# Patient Record
Sex: Female | Born: 1950 | Race: White | Hispanic: No | Marital: Married | Smoking: Never smoker
Health system: Southern US, Community
[De-identification: ages and names within clinical notes are randomized; demographics above are authoritative.]

## PROBLEM LIST (undated history)

## (undated) DIAGNOSIS — I10 Essential (primary) hypertension: Secondary | ICD-10-CM

## (undated) DIAGNOSIS — F419 Anxiety disorder, unspecified: Secondary | ICD-10-CM

## (undated) DIAGNOSIS — M76899 Other specified enthesopathies of unspecified lower limb, excluding foot: Secondary | ICD-10-CM

## (undated) DIAGNOSIS — T7840XA Allergy, unspecified, initial encounter: Secondary | ICD-10-CM

## (undated) DIAGNOSIS — K5909 Other constipation: Secondary | ICD-10-CM

## (undated) DIAGNOSIS — K449 Diaphragmatic hernia without obstruction or gangrene: Secondary | ICD-10-CM

## (undated) DIAGNOSIS — M545 Low back pain, unspecified: Secondary | ICD-10-CM

## (undated) DIAGNOSIS — E785 Hyperlipidemia, unspecified: Secondary | ICD-10-CM

## (undated) DIAGNOSIS — K589 Irritable bowel syndrome without diarrhea: Secondary | ICD-10-CM

## (undated) DIAGNOSIS — M1990 Unspecified osteoarthritis, unspecified site: Secondary | ICD-10-CM

## (undated) DIAGNOSIS — N3090 Cystitis, unspecified without hematuria: Secondary | ICD-10-CM

## (undated) DIAGNOSIS — K219 Gastro-esophageal reflux disease without esophagitis: Secondary | ICD-10-CM

## (undated) DIAGNOSIS — Z5189 Encounter for other specified aftercare: Secondary | ICD-10-CM

## (undated) DIAGNOSIS — H269 Unspecified cataract: Secondary | ICD-10-CM

## (undated) HISTORY — DX: Gastro-esophageal reflux disease without esophagitis: K21.9

## (undated) HISTORY — DX: Cystitis, unspecified without hematuria: N30.90

## (undated) HISTORY — DX: Low back pain, unspecified: M54.50

## (undated) HISTORY — PX: TONSILLECTOMY: SUR1361

## (undated) HISTORY — DX: Low back pain: M54.5

## (undated) HISTORY — DX: Diaphragmatic hernia without obstruction or gangrene: K44.9

## (undated) HISTORY — PX: BACK SURGERY: SHX140

## (undated) HISTORY — DX: Allergy, unspecified, initial encounter: T78.40XA

## (undated) HISTORY — DX: Unspecified cataract: H26.9

## (undated) HISTORY — PX: SPLENECTOMY: SUR1306

## (undated) HISTORY — DX: Other constipation: K59.09

## (undated) HISTORY — PX: ABDOMINAL HYSTERECTOMY: SHX81

## (undated) HISTORY — PX: KNEE SURGERY: SHX244

## (undated) HISTORY — DX: Irritable bowel syndrome, unspecified: K58.9

## (undated) HISTORY — PX: OTHER SURGICAL HISTORY: SHX169

## (undated) HISTORY — PX: COLONOSCOPY: SHX174

## (undated) HISTORY — DX: Anxiety disorder, unspecified: F41.9

## (undated) HISTORY — DX: Hyperlipidemia, unspecified: E78.5

## (undated) HISTORY — DX: Essential (primary) hypertension: I10

## (undated) HISTORY — PX: UPPER GASTROINTESTINAL ENDOSCOPY: SHX188

## (undated) HISTORY — DX: Encounter for other specified aftercare: Z51.89

## (undated) HISTORY — PX: POLYPECTOMY: SHX149

## (undated) HISTORY — DX: Other specified enthesopathies of unspecified lower limb, excluding foot: M76.899

## (undated) HISTORY — DX: Unspecified osteoarthritis, unspecified site: M19.90

---

## 1998-03-20 ENCOUNTER — Encounter: Admission: RE | Admit: 1998-03-20 | Discharge: 1998-06-18 | Payer: Self-pay | Admitting: Anesthesiology

## 1999-09-06 ENCOUNTER — Ambulatory Visit (HOSPITAL_COMMUNITY): Admission: RE | Admit: 1999-09-06 | Discharge: 1999-09-06 | Payer: Self-pay | Admitting: Neurosurgery

## 1999-09-06 ENCOUNTER — Encounter: Payer: Self-pay | Admitting: Neurosurgery

## 2006-11-09 ENCOUNTER — Encounter: Admission: RE | Admit: 2006-11-09 | Discharge: 2006-11-09 | Payer: Self-pay | Admitting: Anesthesiology

## 2007-02-19 ENCOUNTER — Ambulatory Visit (HOSPITAL_COMMUNITY): Admission: RE | Admit: 2007-02-19 | Discharge: 2007-02-19 | Payer: Self-pay | Admitting: Family Medicine

## 2007-04-10 ENCOUNTER — Emergency Department (HOSPITAL_COMMUNITY): Admission: EM | Admit: 2007-04-10 | Discharge: 2007-04-11 | Payer: Self-pay | Admitting: Emergency Medicine

## 2008-05-25 IMAGING — RF DG MYELOGRAM LUMBAR
14 of 18 series · 14 of 18 positions shown · IV contrast (omnipaque)
Comparison: none

CLINICAL DATA: Left low back pain radiating to the left foot.  

 LUMBAR MYELOGRAM:
TECHNIQUE: The low back was prepped and draped in a sterile fashion. Lidocaine was utilized for local anesthesia.  Under fluoroscopic guidance, a 22 gauge spinal needle was inserted into the CSF space at L3-4 via right paramedian approach.  15 cc Omnipaque 180 was administered.  No complications were encountered.
TECHNIQUE: Multidetector CT imaging of the lumbar spine was performed after intrathecal injection of contrast.  Multiplanar CT image reconstructions were also generated.

[Series 1: (hospital) · 1 of 1 slices shown]
[im 1/1]
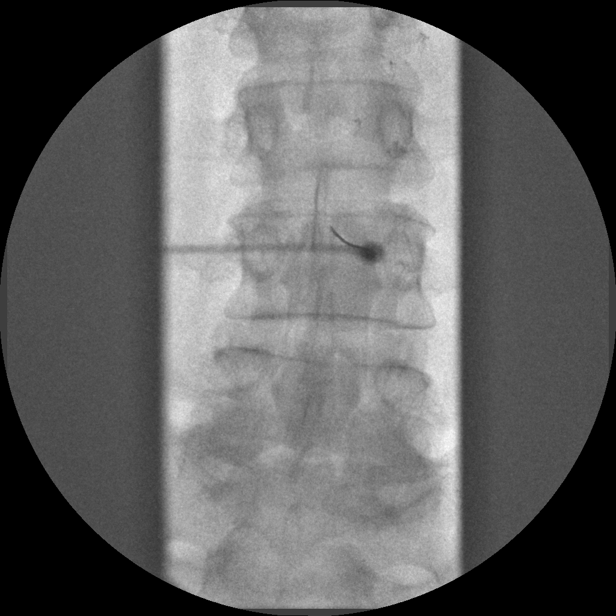

[Series 2: myelogram  white · 1 of 1 slices shown (1 of 13)]
[im 1/1]
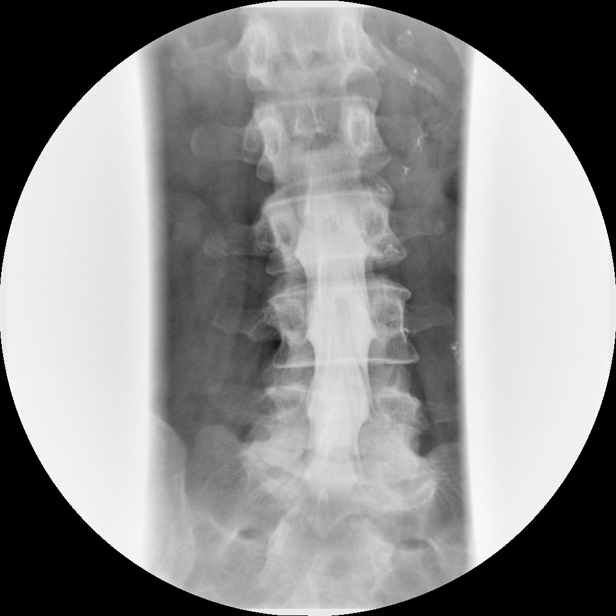

[Series 4: myelogram  white · 1 of 1 slices shown (2 of 13)]
[im 1/1]
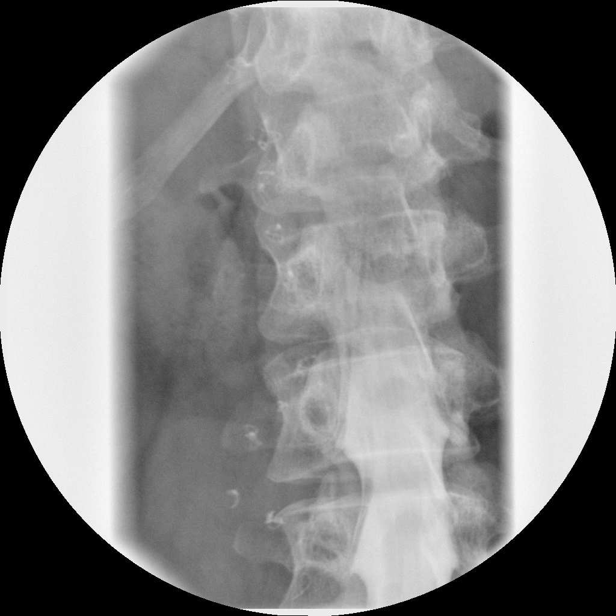

[Series 5: myelogram  white · 1 of 1 slices shown (3 of 13)]
[im 1/1]
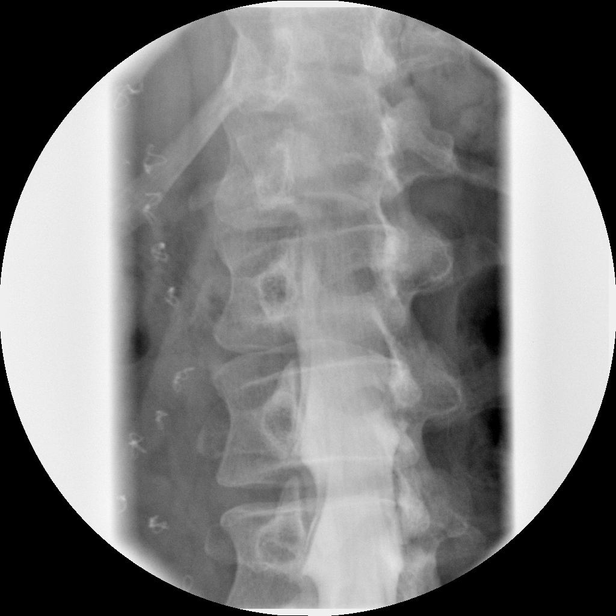

[Series 6: myelogram  white · 1 of 1 slices shown (4 of 13)]
[im 1/1]
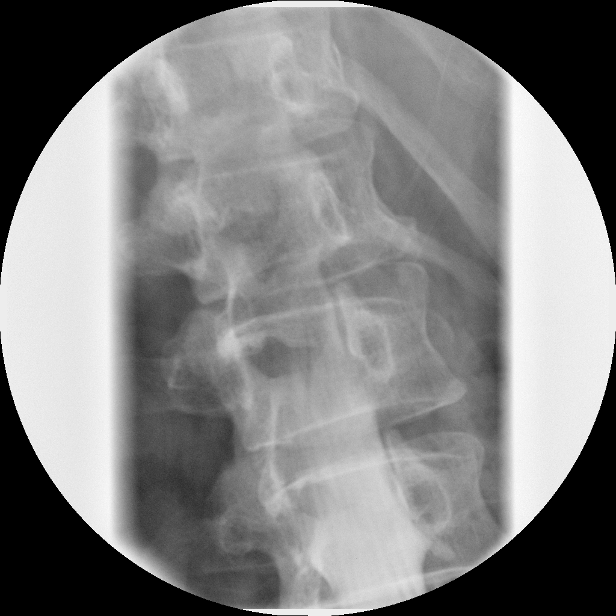

[Series 8: myelogram  white · 1 of 1 slices shown (5 of 13)]
[im 1/1]
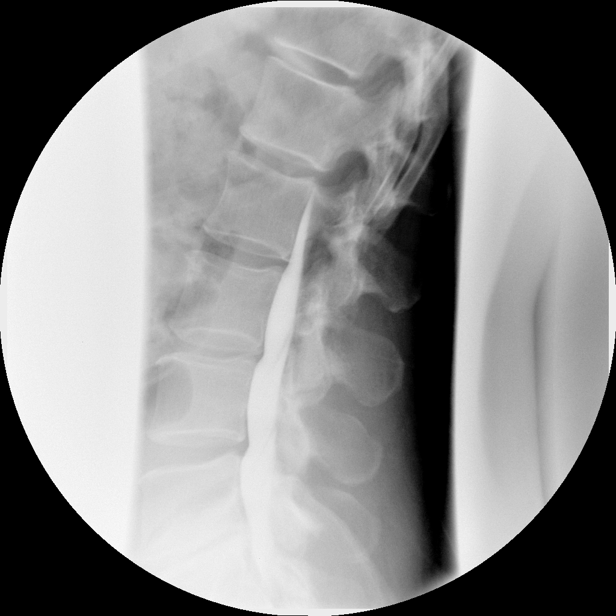

[Series 9: myelogram  white · 1 of 1 slices shown (6 of 13)]
[im 1/1]
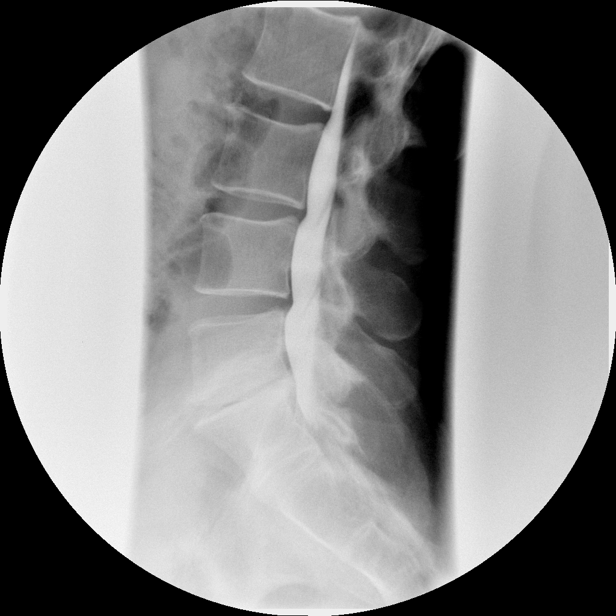

[Series 10: myelogram  white · 1 of 1 slices shown (7 of 13)]
[im 1/1]
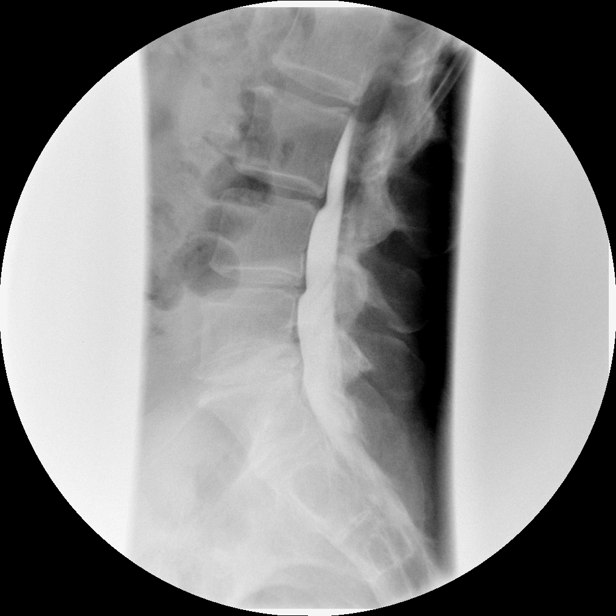

[Series 11: myelogram  white · 1 of 1 slices shown (8 of 13)]
[im 1/1]
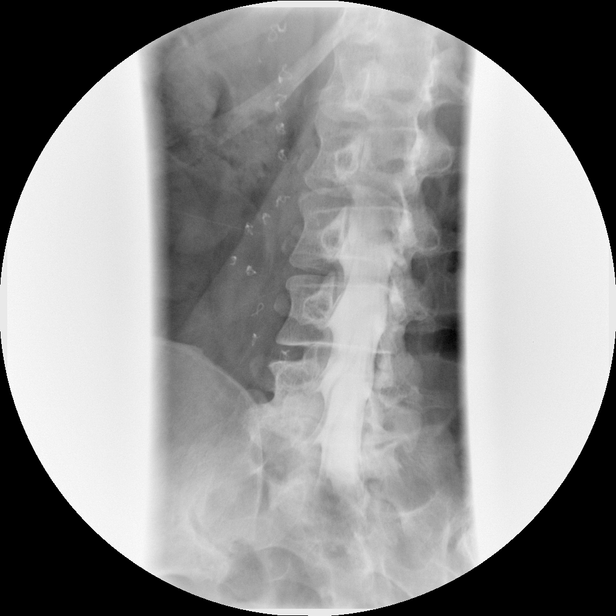

[Series 13: myelogram  white · 1 of 1 slices shown (9 of 13)]
[im 1/1]
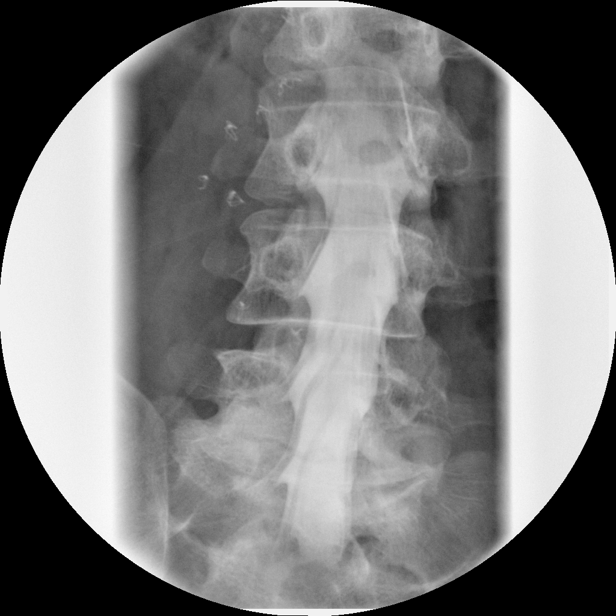

[Series 14: myelogram  white · 1 of 1 slices shown (10 of 13)]
[im 1/1]
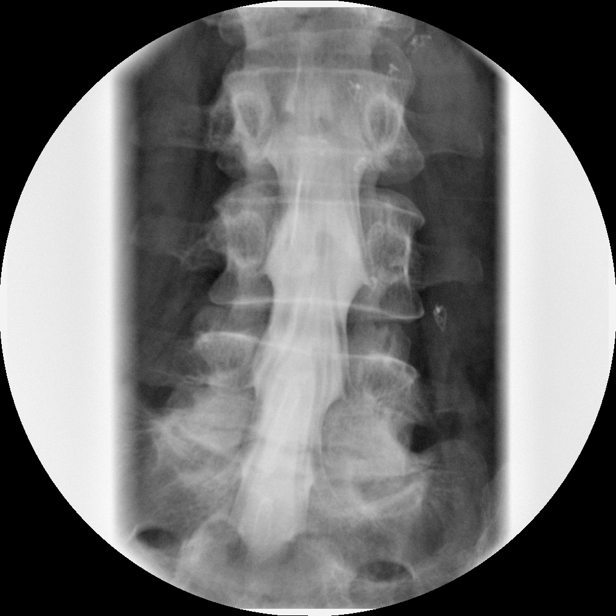

[Series 15: myelogram  white · 1 of 1 slices shown (11 of 13)]
[im 1/1]
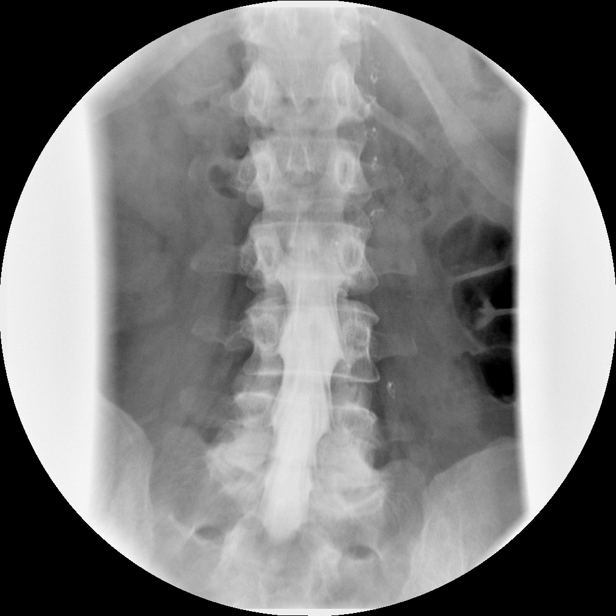

[Series 17: myelogram  white · 1 of 1 slices shown (12 of 13)]
[im 1/1]
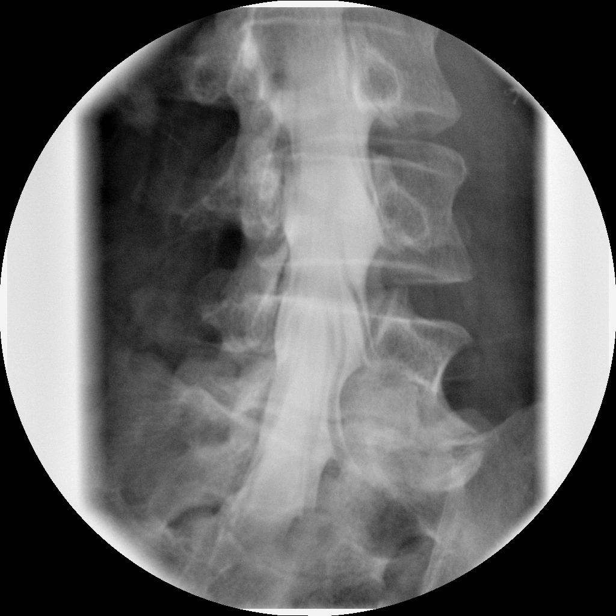

[Series 18: myelogram  white · 1 of 1 slices shown (13 of 13)]
[im 1/1]
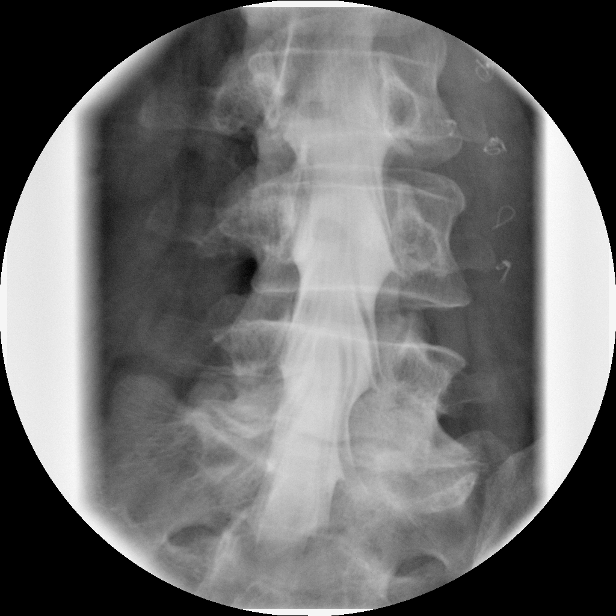

[14 of 18 positions shown; findings below may reference images not displayed]

FINDINGS: There is mild narrowing at L3-4 and L4-5.  There is severe narrowing of the L5-S1 disk with facet arthropathy.  There is no evidence of spinal stenosis at any lumbar level.  The nerve root sleeves are not effaced.
IMPRESSION: Lumbar myelogram as described demonstrating degenerative disk disease.

 POST MYELOGRAM CT SCAN OF THE LUMBAR SPINE:
FINDINGS: There is anatomic alignment of the vertebral bodies.  There is no vertebral body height loss.  There is mild narrowing at L3-4 and L4-5.  Severe narrowing at L5-S1 is present with endplate sclerotic change and bone-upon-bone.  Vacuum disk is noted.  The conus medullaris terminates at the lower L2 endplate.
 L1-2:  Unremarkable.
 L2-3:  Unremarkable.
 L3-4:  A left foraminal disk protrusion is noted.  This results in mild foraminal narrowing, but no nerve root impingement.  Mild ligamentum flavum hypertrophy.  No lateral recess or central stenosis.
 L4-5:  A shallow left foraminal disk protrusion is noted causing mild foraminal narrowing, but no neural impingement.  No central or foraminal narrowing.  No lateral recess narrowing.   Minimal facet arthropathy.
 L5-S1:  No disk herniation, central or lateral recess narrowing.  There is moderate right foraminal and mild left foraminal narrowing that is multifactorial without soft disk material.
IMPRESSION: 1.  Shallow foraminal disk protrusions at L3-4 and L4-5.
 2.  Multifactorial degenerative narrowing of the foramina at L5-S1 as described.

## 2017-12-08 ENCOUNTER — Encounter: Payer: Self-pay | Admitting: Gastroenterology

## 2018-01-15 ENCOUNTER — Ambulatory Visit (INDEPENDENT_AMBULATORY_CARE_PROVIDER_SITE_OTHER): Payer: Medicare Other | Admitting: Gastroenterology

## 2018-01-15 ENCOUNTER — Encounter: Payer: Self-pay | Admitting: Gastroenterology

## 2018-01-15 VITALS — BP 138/76 | HR 82 | Ht 64.0 in | Wt 160.0 lb

## 2018-01-15 DIAGNOSIS — Z86010 Personal history of colonic polyps: Secondary | ICD-10-CM | POA: Diagnosis not present

## 2018-01-15 DIAGNOSIS — Z1211 Encounter for screening for malignant neoplasm of colon: Secondary | ICD-10-CM

## 2018-01-15 MED ORDER — SOD PICOSULFATE-MAG OX-CIT ACD 10-3.5-12 MG-GM -GM/160ML PO SOLN: 1.0000 | Freq: Once | ORAL | 0 refills | Status: AC

## 2018-01-15 NOTE — Progress Notes (Signed)
Chief Complaint: For colorectal cancer screening  Referring Provider:  No ref. provider found      ASSESSMENT AND PLAN;   #1. Colorectal cancer screening #2. Chronic constipation-failed Linzess, currently on senna 11/day #3. H/O colonic polyps (colonoscopy 2009 by Dr. Melina Copa) -Continue senna for now.  Would like to wean her off gradually. - Proceed with colonoscopy.  I have discussed the risks and benefits. Patient is fully aware and agrees to proceed. All the questions were answered. Colonoscopy will be scheduled in upcoming days.  Patient is to report immediately if there is any significant weight loss or excessive bleeding until then. Consent forms were given for review.   HPI:    Molly Friedman is a 67 y.o. female  For colorectal cancer screening Had colonoscopy with Dr. Melina Copa in 2009-1 colonic polyp was removed apparently. Has chronic constipation ever since her teenage years. Has been on senna initially 2, then 7 and now 11/day Drinks plenty of water Eats high-fiber diet Has been physically active No melena or hematochezia No weight loss.  Past GI H/O 1. GERD with ?HH - Seen by Dr Carlton Adam at [facility], status post 24-hour pH and manometry 10/02/14 -was offered surgery, went for second opinion-Dr. Lininger surgery would not help. Past Medical History:  Diagnosis Date  . Anxiety   . Chronic constipation   . Enthesopathy of hip   . GERD (gastroesophageal reflux disease)   . Hiatal hernia   . Hyperlipidemia   . IBS (irritable bowel syndrome)   . Lumbago   . Osteoarthritis     Family History  Problem Relation Age of Onset  . Colon cancer Neg Hx     Social History   Tobacco Use  . Smoking status: Never Smoker  . Smokeless tobacco: Never Used  Substance Use Topics  . Alcohol use: Never    Frequency: Never  . Drug use: Not on file    Current Outpatient Medications  Medication Sig Dispense Refill  . acetaminophen (TYLENOL) 500 MG tablet Take 500 mg by mouth  every 6 (six) hours as needed.    Marland Kitchen azelastine (ASTELIN) 0.1 % nasal spray Place into both nostrils 2 (two) times daily. Use in each nostril as directed    . Calcium Carb-Cholecalciferol (CALCIUM 500/D) 500-400 MG-UNIT CHEW Chew by mouth.    . Cholecalciferol 5000 units capsule Take 5,000 Units by mouth daily.    . Cyanocobalamin (VITAMIN B 12 PO) Take by mouth.    . fluticasone (FLONASE) 50 MCG/ACT nasal spray Place into both nostrils daily.    Marland Kitchen LORazepam (ATIVAN) 0.5 MG tablet Take 0.5 mg by mouth every 8 (eight) hours.    . Magnesium 250 MG TABS Take by mouth.    . omega-3 acid ethyl esters (LOVAZA) 1 g capsule Take by mouth 2 (two) times daily.    Marland Kitchen omeprazole (PRILOSEC) 40 MG capsule Take 40 mg by mouth daily.    . ranitidine (ZANTAC) 150 MG tablet Take 150 mg by mouth 2 (two) times daily.    . sennosides-docusate sodium (SENOKOT-S) 8.6-50 MG tablet Take 1 tablet by mouth daily.    . Sod Picosulfate-Mag Ox-Cit Acd (CLENPIQ) 10-3.5-12 MG-GM -GM/160ML SOLN Take 1 kit by mouth once for 1 dose. 2 Bottle 0   No current facility-administered medications for this visit.     No Known Allergies  Review of Systems:  Constitutional: Denies fever, chills, diaphoresis, appetite change and fatigue.  HEENT: Denies photophobia, eye pain, redness, hearing loss, ear  pain, congestion, sore throat, rhinorrhea, sneezing, mouth sores, neck pain, neck stiffness and tinnitus.   Respiratory: Denies SOB, DOE, cough, chest tightness,  and wheezing.   Cardiovascular: Denies chest pain, palpitations and leg swelling.  Genitourinary: Denies dysuria, urgency, frequency, hematuria, flank pain and difficulty urinating.  Musculoskeletal: Denies myalgias, back pain, joint swelling, arthralgias and gait problem.  Skin: No rash.  Neurological: Denies dizziness, seizures, syncope, weakness, light-headedness, numbness and headaches.  Hematological: Denies adenopathy. Easy bruising, personal or family bleeding history    Psychiatric/Behavioral: No anxiety or depression     Physical Exam:    BP 138/76   Pulse 82   Ht 5' 4"  (1.626 m)   Wt 160 lb (72.6 kg)   BMI 27.46 kg/m  Filed Weights   01/15/18 1519  Weight: 160 lb (72.6 kg)   Constitutional:  Well-developed, in no acute distress. Psychiatric: Normal mood and affect. Behavior is normal. HEENT: Pupils normal.  Conjunctivae are normal. No scleral icterus. Neck supple.  Cardiovascular: Normal rate, regular rhythm. No edema Pulmonary/chest: Effort normal and breath sounds normal. No wheezing, rales or rhonchi. Abdominal: Soft, nondistended. Nontender. Bowel sounds active throughout. There are no masses palpable. No hepatomegaly. Rectal:  defered Neurological: Alert and oriented to person place and time. Skin: Skin is warm and dry. No rashes noted.    Carmell Austria, MD 01/15/2018, 4:18 PM  Cc: No ref. provider found

## 2018-01-15 NOTE — Patient Instructions (Signed)
If you are age 65 or older, your body mass index should be between 23-30. Your Body mass index is 27.46 kg/m. If this is out of the aforementioned range listed, please consider follow up with your Primary Care Provider.  If you are age 79 or younger, your body mass index should be between 19-25. Your Body mass index is 27.46 kg/m. If this is out of the aformentioned range listed, please consider follow up with your Primary Care Provider.   You have been scheduled for a colonoscopy. Please follow written instructions given to you at your visit today.  Please pick up your prep supplies at the pharmacy within the next 1-3 days. If you use inhalers (even only as needed), please bring them with you on the day of your procedure. Your physician has requested that you go to www.startemmi.com and enter the access code given to you at your visit today. This web site gives a general overview about your procedure. However, you should still follow specific instructions given to you by our office regarding your preparation for the procedure.  We have sent the following medications to your pharmacy for you to pick up at your convenience: Clenpiq  Thank you,  Dr. Jackquline Denmark

## 2018-01-18 ENCOUNTER — Telehealth: Payer: Self-pay | Admitting: Gastroenterology

## 2018-01-18 NOTE — Telephone Encounter (Signed)
Pt calling in wanting pre authorization for prep meds

## 2018-01-18 NOTE — Telephone Encounter (Signed)
Called and answered patients questions about prep instructions.

## 2018-01-19 NOTE — Telephone Encounter (Signed)
I think this should have gone to you.

## 2018-01-19 NOTE — Telephone Encounter (Signed)
I believe this needs to go to your assistants- I wasn't sure of their names.

## 2018-01-19 NOTE — Telephone Encounter (Signed)
Pl see

## 2018-01-19 NOTE — Telephone Encounter (Signed)
Thanks for letting me know. Is it anything I need to do ?

## 2018-02-10 ENCOUNTER — Other Ambulatory Visit: Payer: Self-pay

## 2018-02-10 ENCOUNTER — Encounter: Payer: Self-pay | Admitting: Gastroenterology

## 2018-02-10 ENCOUNTER — Ambulatory Visit (AMBULATORY_SURGERY_CENTER): Payer: Medicare Other | Admitting: Gastroenterology

## 2018-02-10 VITALS — BP 125/73 | HR 56 | Temp 98.0°F | Resp 11 | Ht 64.0 in | Wt 160.0 lb

## 2018-02-10 DIAGNOSIS — D123 Benign neoplasm of transverse colon: Secondary | ICD-10-CM | POA: Diagnosis not present

## 2018-02-10 DIAGNOSIS — Z1211 Encounter for screening for malignant neoplasm of colon: Secondary | ICD-10-CM | POA: Diagnosis present

## 2018-02-10 MED ORDER — SODIUM CHLORIDE 0.9 % IV SOLN: 500.0000 mL | Freq: Once | INTRAVENOUS | Status: DC

## 2018-02-10 NOTE — Patient Instructions (Signed)
Thank you for allowing [place] to care for you today!  Await pathology results by mail, up to 2 weeks.  Handout for polyps and hemorrhoids given.     YOU HAD AN ENDOSCOPIC PROCEDURE TODAY AT [facility]:   Refer to the procedure report that was given to you for any specific questions about what was found during the examination.  If the procedure report does not answer your questions, please call your gastroenterologist to clarify.  If you requested that your care partner not be given the details of your procedure findings, then the procedure report has been included in a sealed envelope for you to review at your convenience later.  YOU SHOULD EXPECT: Some feelings of bloating in the abdomen. Passage of more gas than usual.  Walking can help get rid of the air that was put into your GI tract during the procedure and reduce the bloating. If you had a lower endoscopy (such as a colonoscopy or flexible sigmoidoscopy) you may notice spotting of blood in your stool or on the toilet paper. If you underwent a bowel prep for your procedure, you may not have a normal bowel movement for a few days.  Please Note:  You might notice some irritation and congestion in your nose or some drainage.  This is from the oxygen used during your procedure.  There is no need for concern and it should clear up in a day or so.  SYMPTOMS TO REPORT IMMEDIATELY:   Following lower endoscopy (colonoscopy or flexible sigmoidoscopy):  Excessive amounts of blood in the stool  Significant tenderness or worsening of abdominal pains  Swelling of the abdomen that is new, acute  Fever of 100F or higher   Following upper endoscopy (EGD)  Vomiting of blood or coffee ground material  New chest pain or pain under the shoulder blades  Painful or persistently difficult swallowing  New shortness of breath  Fever of 100F or higher  Black, tarry-looking stools  For urgent or emergent issues, a gastroenterologist can be  reached at any hour by calling [phone].   DIET:  We do recommend a small meal at first, but then you may proceed to your regular diet.  Drink plenty of fluids but you should avoid alcoholic beverages for 24 hours.  ACTIVITY:  You should plan to take it easy for the rest of today and you should NOT DRIVE or use heavy machinery until tomorrow (because of the sedation medicines used during the test).    FOLLOW UP: Our staff will call the number listed on your records the next business day following your procedure to check on you and address any questions or concerns that you may have regarding the information given to you following your procedure. If we do not reach you, we will leave a message.  However, if you are feeling well and you are not experiencing any problems, there is no need to return our call.  We will assume that you have returned to your regular daily activities without incident.  If any biopsies were taken you will be contacted by phone or by letter within the next 1-3 weeks.  Please call us at [phone] if you have not heard about the biopsies in 3 weeks.    SIGNATURES/CONFIDENTIALITY: You and/or your care partner have signed paperwork which will be entered into your electronic medical record.  These signatures attest to the fact that that the information above on your After Visit Summary has been reviewed  and is understood.  Full responsibility of the confidentiality of this discharge information lies with you and/or your care-partner. 

## 2018-02-10 NOTE — Op Note (Signed)
[facility] Patient Name: Molly Friedman Procedure Date: 02/10/2018 11:16 AM MRN: [id number] Endoscopist: Jackquline Denmark , MD Age: 67 Referring MD:  Date of Birth: 08/27/1950 Gender: Female Account #: [id number] Procedure:                Colonoscopy Indications:              Screening for colorectal malignant neoplasm. ? H/O                            polyps. Medicines:                Monitored Anesthesia Care Procedure:                Pre-Anesthesia Assessment:                           - Prior to the procedure, a History and Physical                            was performed, and patient medications and                            allergies were reviewed. The patient's tolerance of                            previous anesthesia was also reviewed. The risks                            and benefits of the procedure and the sedation                            options and risks were discussed with the patient.                            All questions were answered, and informed consent                            was obtained. Prior Anticoagulants: The patient has                            taken no previous anticoagulant or antiplatelet                            agents. ASA Grade Assessment: I - A normal, healthy                            patient. After reviewing the risks and benefits,                            the patient was deemed in satisfactory condition to                            undergo the procedure.  After obtaining informed consent, the colonoscope                            was passed under direct vision. Throughout the                            procedure, the patient's blood pressure, pulse, and                            oxygen saturations were monitored continuously. The                            Model PCF-H190DL [id number]) scope was introduced                            through the anus and advanced to the 2 cm into the                ileum. The colonoscopy was performed without                            difficulty. The patient tolerated the procedure                            well. The quality of the bowel preparation was                            adequate to identify polyps 6 mm and larger in size. Scope In: 11:23:17 AM Scope Out: 11:40:13 AM Scope Withdrawal Time: 0 hours 13 minutes 17 seconds  Total Procedure Duration: 0 hours 16 minutes 56 seconds  Findings:                 A 4 mm polyp was found in the distal transverse                            colon. The polyp was sessile. The polyp was removed                            with a cold biopsy forceps. Resection and retrieval                            were complete. Estimated blood loss: none.                           An area of mild melanosis was found in the entire                            colon.                           Rare diverticula in the sigmoid colon.                           Non-bleeding internal hemorrhoids were found. The  hemorrhoids were small.                           The exam was otherwise without abnormality on                            direct and retroflexion views. Complications:            No immediate complications. Estimated Blood Loss:     Estimated blood loss: none. Impression:               - Colon polyp status post polypectomy.                           - Mild melanosis coli.                           - Non-bleeding internal hemorrhoids.                           - The examination was otherwise normal on direct                            and retroflexion views. Recommendation:           - Patient has a contact number available for                            emergencies. The signs and symptoms of potential                            delayed complications were discussed with the                            patient. Return to normal activities tomorrow.                            Written  discharge instructions were provided to the                            patient.                           - Resume previous diet.                           - Continue present medications.                           - Await pathology results.                           - Repeat colonoscopy for surveillance based on                            pathology results.                           - Miralax  1 capful (17 grams) in 8 ounces of water                            PO BID. Lowest dose of Senna. Jackquline Denmark, MD 02/10/2018 11:47:54 AM This report has been signed electronically.

## 2018-02-10 NOTE — Progress Notes (Signed)
Called to room to assist during endoscopic procedure.  Patient ID and intended procedure confirmed with present staff. Received instructions for my participation in the procedure from the performing physician.  

## 2018-02-10 NOTE — Progress Notes (Signed)
Late entry for 1145   Alert and oriented x 3, pleased with MAC, report to RN

## 2018-02-11 ENCOUNTER — Telehealth: Payer: Self-pay

## 2018-02-11 NOTE — Telephone Encounter (Signed)
  Follow up Call-  Call back number 02/10/2018  Post procedure Call Back phone  # [phone]  Permission to leave phone message Yes  Some recent data might be hidden     Patient questions:  Do you have a fever, pain , or abdominal swelling? No. Pain Score  0 *  Have you tolerated food without any problems? Yes.    Have you been able to return to your normal activities? Yes.    Do you have any questions about your discharge instructions: Diet   No. Medications  No. Follow up visit  No.  Do you have questions or concerns about your Care? No.  Actions: * If pain score is 4 or above: No action needed, pain <4.

## 2018-02-18 ENCOUNTER — Encounter: Payer: Self-pay | Admitting: Gastroenterology

## 2020-03-16 ENCOUNTER — Telehealth: Payer: Self-pay | Admitting: Gastroenterology

## 2020-03-16 NOTE — Telephone Encounter (Signed)
Able to reach patient who reports having bladder pressure and frequency. She saw Dr Richardean Canal for this matter who said her symptoms were not her bladder but felt it was a GI issue.Patient states  her urine test was negative for infection. Patient staes she is having abdominal discomfort but feels its her bladder. She was advised to contact her PCP or go to urgent care as our office does not have any immediate appointments available. Patient was only  seen in 2019 for a colonoscopy. Patient will contact her PCP.

## 2020-03-16 NOTE — Telephone Encounter (Signed)
Unable to reach patient by phone. Busy single will ccall back and says "Your call cannot be completed"

## 2020-03-16 NOTE — Telephone Encounter (Signed)
Patient called states she is having a lot of abdominal pressure and pain seeking advise

## 2020-05-18 ENCOUNTER — Ambulatory Visit: Payer: Medicare Other | Admitting: Gastroenterology

## 2020-07-05 ENCOUNTER — Institutional Professional Consult (permissible substitution): Payer: Medicare Other | Admitting: Emergency Medicine

## 2020-07-11 ENCOUNTER — Institutional Professional Consult (permissible substitution): Payer: Medicare Other | Admitting: Emergency Medicine

## 2020-07-27 ENCOUNTER — Institutional Professional Consult (permissible substitution): Payer: Medicare Other | Admitting: Emergency Medicine

## 2020-07-30 HISTORY — PX: OTHER SURGICAL HISTORY: SHX169

## 2020-08-02 ENCOUNTER — Ambulatory Visit: Payer: Medicare Other | Admitting: Pulmonary Disease

## 2020-08-02 ENCOUNTER — Other Ambulatory Visit: Payer: Self-pay

## 2020-08-02 ENCOUNTER — Encounter: Payer: Self-pay | Admitting: Pulmonary Disease

## 2020-08-02 VITALS — BP 124/78 | HR 80 | Temp 98.2°F | Ht 64.0 in | Wt 183.1 lb

## 2020-08-02 DIAGNOSIS — Z8616 Personal history of COVID-19: Secondary | ICD-10-CM | POA: Diagnosis not present

## 2020-08-02 DIAGNOSIS — R9389 Abnormal findings on diagnostic imaging of other specified body structures: Secondary | ICD-10-CM

## 2020-08-02 DIAGNOSIS — Z7185 Encounter for immunization safety counseling: Secondary | ICD-10-CM | POA: Diagnosis not present

## 2020-08-02 DIAGNOSIS — R911 Solitary pulmonary nodule: Secondary | ICD-10-CM

## 2020-08-02 NOTE — Patient Instructions (Addendum)
Thank you for visiting Dr. Valeta Harms at [facility]. Today we recommend the following:  Orders Placed This Encounter  Procedures  . CT Super D Chest Wo Contrast    Return in about 2 weeks (around 08/16/2020) for Tele-visit with Dr. Valeta Harms . After CT Chest as been completed.     Please do your part to reduce the spread of COVID-19.
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
## 2020-08-07 ENCOUNTER — Ambulatory Visit (INDEPENDENT_AMBULATORY_CARE_PROVIDER_SITE_OTHER)
Admission: RE | Admit: 2020-08-07 | Discharge: 2020-08-07 | Disposition: A | Discharge status: Home / Self Care | Payer: Medicare Other | Source: Ambulatory Visit | Attending: Pulmonary Disease | Admitting: Pulmonary Disease

## 2020-08-07 ENCOUNTER — Other Ambulatory Visit: Payer: Self-pay

## 2020-08-07 DIAGNOSIS — R911 Solitary pulmonary nodule: Secondary | ICD-10-CM

## 2020-08-13 ENCOUNTER — Other Ambulatory Visit: Payer: Self-pay

## 2020-08-13 ENCOUNTER — Ambulatory Visit (INDEPENDENT_AMBULATORY_CARE_PROVIDER_SITE_OTHER): Payer: Medicare Other | Admitting: Pulmonary Disease

## 2020-08-13 DIAGNOSIS — I7090 Unspecified atherosclerosis: Secondary | ICD-10-CM

## 2020-08-13 DIAGNOSIS — R911 Solitary pulmonary nodule: Secondary | ICD-10-CM
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
## 2020-09-05 ENCOUNTER — Ambulatory Visit: Payer: Medicare Other | Admitting: Cardiology

## 2020-09-06 ENCOUNTER — Telehealth: Payer: Self-pay | Admitting: Pulmonary Disease

## 2020-09-11 NOTE — Telephone Encounter (Signed)
Pt sched @ [facility] 5/2 @ 9:30.  Pt give appt info.  Pt sched a/ BI 5/5 @ 10:15.  Nothing further needed at this time.

## 2020-10-29 ENCOUNTER — Ambulatory Visit (INDEPENDENT_AMBULATORY_CARE_PROVIDER_SITE_OTHER)
Admission: RE | Admit: 2020-10-29 | Discharge: 2020-10-29 | Disposition: A | Discharge status: Home / Self Care | Payer: Medicare Other | Source: Ambulatory Visit | Attending: Pulmonary Disease | Admitting: Pulmonary Disease

## 2020-10-29 ENCOUNTER — Other Ambulatory Visit: Payer: Self-pay

## 2020-10-29 DIAGNOSIS — R911 Solitary pulmonary nodule: Secondary | ICD-10-CM | POA: Diagnosis not present

## 2020-11-01 ENCOUNTER — Other Ambulatory Visit: Payer: Self-pay

## 2020-11-01 ENCOUNTER — Ambulatory Visit: Payer: Medicare Other | Admitting: Pulmonary Disease

## 2020-11-01 ENCOUNTER — Encounter: Payer: Self-pay | Admitting: Pulmonary Disease

## 2020-11-01 VITALS — BP 120/80 | HR 89 | Temp 98.2°F | Ht 64.0 in | Wt 184.8 lb

## 2020-11-01 DIAGNOSIS — Z8616 Personal history of COVID-19: Secondary | ICD-10-CM

## 2020-11-01 DIAGNOSIS — J40 Bronchitis, not specified as acute or chronic: Secondary | ICD-10-CM | POA: Diagnosis not present

## 2020-11-01 DIAGNOSIS — R911 Solitary pulmonary nodule: Secondary | ICD-10-CM

## 2020-11-01 DIAGNOSIS — R9389 Abnormal findings on diagnostic imaging of other specified body structures: Secondary | ICD-10-CM

## 2020-11-01 DIAGNOSIS — R0602 Shortness of breath: Secondary | ICD-10-CM

## 2020-11-01 MED ORDER — PREDNISONE 10 MG PO TABS: ORAL_TABLET | ORAL | 0 refills | Status: DC

## 2020-11-01 MED ORDER — ALBUTEROL SULFATE HFA 108 (90 BASE) MCG/ACT IN AERS: 2.0000 | INHALATION_SPRAY | Freq: Four times a day (QID) | RESPIRATORY_TRACT | 6 refills | Status: DC | PRN
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
## 2020-11-01 NOTE — Patient Instructions (Signed)
Thank you for visiting Dr. Valeta Harms at [facility]. Today we recommend the following:  Orders Placed This Encounter  Procedures  . CT Super D Chest Wo Contrast   Meds ordered this encounter  Medications  . predniSONE (DELTASONE) 10 MG tablet    Sig: Take 4 tabs by mouth once daily x4 days, then 3 tabs x4 days, 2 tabs x4 days, 1 tab x4 days and stop.    Dispense:  40 tablet    Refill:  0  . albuterol (VENTOLIN HFA) 108 (90 Base) MCG/ACT inhaler    Sig: Inhale 2 puffs into the lungs every 6 (six) hours as needed for wheezing or shortness of breath.    Dispense:  8 g    Refill:  6   Return in about 4 weeks (around 11/29/2020) for with APP or Dr. Valeta Harms.    Please do your part to reduce the spread of COVID-19.

## 2020-11-02 ENCOUNTER — Telehealth: Payer: Self-pay | Admitting: Pulmonary Disease

## 2020-11-02 MED ORDER — DOXYCYCLINE HYCLATE 100 MG PO TABS: 100.0000 mg | ORAL_TABLET | Freq: Two times a day (BID) | ORAL | 0 refills | Status: DC

## 2020-11-02 NOTE — Telephone Encounter (Signed)
Spoke with the pt  She is taking the pred taper Icard gave her yesterday and she is having shakiness since then  She states that the has never taken a dose of 40 mg daily and feels this was too much to start with  She is also starting to cough up minimal green sputum and wonders if she needs abx  Ears still feel full  Still same SOB as when was here at [facility]  She has used her albuterol inhaler once  No f/c/s  Please advise thanks

## 2020-11-02 NOTE — Telephone Encounter (Signed)
I will send in Doxycyline 1 tab twice daily x 7 days for  bronchitis  (take with food and wear sunscreen when outdoors). Take mucinex 600-1200mg  twice daily with glass of water. She can  take 20mg  prednisone x 5 days; then 10mg  x 5 days; then stop.

## 2020-11-02 NOTE — Telephone Encounter (Signed)
Spoke with the pt and notified of response per BW  She verbalized understanding  Nothing further needed

## 2020-11-06 DIAGNOSIS — R079 Chest pain, unspecified: Secondary | ICD-10-CM | POA: Diagnosis not present

## 2020-11-15 ENCOUNTER — Ambulatory Visit: Payer: Medicare Other | Admitting: Cardiology

## 2020-12-03 ENCOUNTER — Other Ambulatory Visit: Payer: Self-pay

## 2020-12-03 ENCOUNTER — Encounter: Payer: Self-pay | Admitting: Pulmonary Disease

## 2020-12-03 ENCOUNTER — Ambulatory Visit (INDEPENDENT_AMBULATORY_CARE_PROVIDER_SITE_OTHER): Payer: Medicare Other | Admitting: Pulmonary Disease

## 2020-12-03 VITALS — BP 130/80 | HR 82 | Temp 97.9°F | Ht 64.0 in | Wt 186.0 lb

## 2020-12-03 DIAGNOSIS — R911 Solitary pulmonary nodule: Secondary | ICD-10-CM | POA: Diagnosis not present

## 2020-12-03 DIAGNOSIS — Z8616 Personal history of COVID-19: Secondary | ICD-10-CM | POA: Diagnosis not present

## 2020-12-03 DIAGNOSIS — R9389 Abnormal findings on diagnostic imaging of other specified body structures: Secondary | ICD-10-CM
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
## 2020-12-03 NOTE — Patient Instructions (Signed)
Thank you for visiting Dr. Valeta Harms at [facility]. Today we recommend the following:  Orders Placed This Encounter  Procedures  . CT Super D Chest Wo Contrast   After CT Chest complete to discuss bronchoscopy if needed   Return in about 5 months (around 05/05/2021) for w/ Dr. Valeta Harms .    Please do your part to reduce the spread of COVID-19.

## 2021-02-19 ENCOUNTER — Emergency Department (HOSPITAL_COMMUNITY): Payer: Medicare Other

## 2021-02-19 ENCOUNTER — Other Ambulatory Visit: Payer: Self-pay

## 2021-02-19 ENCOUNTER — Emergency Department (HOSPITAL_COMMUNITY)
Admission: EM | Admit: 2021-02-19 | Discharge: 2021-02-20 | Disposition: A | Discharge status: Home / Self Care | Payer: Medicare Other | Attending: Emergency Medicine | Admitting: Emergency Medicine

## 2021-02-19 DIAGNOSIS — Z79899 Other long term (current) drug therapy: Secondary | ICD-10-CM | POA: Insufficient documentation

## 2021-02-19 DIAGNOSIS — Z91040 Latex allergy status: Secondary | ICD-10-CM | POA: Diagnosis not present

## 2021-02-19 DIAGNOSIS — R079 Chest pain, unspecified: Secondary | ICD-10-CM | POA: Diagnosis present

## 2021-02-19 DIAGNOSIS — U071 COVID-19: Secondary | ICD-10-CM | POA: Diagnosis not present

## 2021-02-19 LAB — COMPREHENSIVE METABOLIC PANEL
ALT: 26 U/L (ref 0–44)
AST: 25 U/L (ref 15–41)
Albumin: 3.6 g/dL (ref 3.5–5.0)
Alkaline Phosphatase: 59 U/L (ref 38–126)
Anion gap: 8 (ref 5–15)
BUN: 21 mg/dL (ref 8–23)
CO2: 25 mmol/L (ref 22–32)
Calcium: 9.2 mg/dL (ref 8.9–10.3)
Chloride: 106 mmol/L (ref 98–111)
Creatinine, Ser: 0.78 mg/dL (ref 0.44–1.00)
GFR, Estimated: >60 mL/min (ref >60)
Glucose, Bld: 113 mg/dL — ABNORMAL HIGH (ref 70–99)
Potassium: 3.8 mmol/L (ref 3.5–5.1)
Sodium: 139 mmol/L (ref 135–145)
Total Bilirubin: 0.4 mg/dL (ref 0.3–1.2)
Total Protein: 6.6 g/dL (ref 6.5–8.1)

## 2021-02-19 LAB — RESP PANEL BY RT-PCR (FLU A&B, COVID) ARPGX2
Influenza A by PCR: NEGATIVE
Influenza B by PCR: NEGATIVE
SARS Coronavirus 2 by RT PCR: POSITIVE — AB

## 2021-02-19 LAB — CBC
HCT: 43 % (ref 36.0–46.0)
Hemoglobin: 14.4 g/dL (ref 12.0–15.0)
MCH: 30.3 pg (ref 26.0–34.0)
MCHC: 33.5 g/dL (ref 30.0–36.0)
MCV: 90.5 fL (ref 80.0–100.0)
Platelets: 429 10*3/uL — ABNORMAL HIGH (ref 150–400)
RBC: 4.75 MIL/uL (ref 3.87–5.11)
RDW: 15.2 % (ref 11.5–15.5)
WBC: 7.3 10*3/uL (ref 4.0–10.5)
nRBC: 0 % (ref 0.0–0.2)

## 2021-02-19 LAB — TROPONIN I (HIGH SENSITIVITY): Troponin I (High Sensitivity): 5 ng/L (ref <18)

## 2021-02-19 LAB — MAGNESIUM: Magnesium: 2.2 mg/dL (ref 1.7–2.4)

## 2021-02-19 LAB — D-DIMER, QUANTITATIVE: D-Dimer, Quant: 0.65 ug/mL-FEU — ABNORMAL HIGH (ref 0.00–0.50)

## 2021-02-19 LAB — LIPASE, BLOOD: Lipase: 29 U/L (ref 11–51)

## 2021-02-19 MED ORDER — NITROGLYCERIN 0.4 MG SL SUBL: 0.4000 mg | SUBLINGUAL_TABLET | SUBLINGUAL | Status: DC | PRN

## 2021-02-19 MED ORDER — ACETAMINOPHEN 325 MG PO TABS
650.0000 mg | ORAL_TABLET | Freq: Once | ORAL | Status: AC
  Administered 2021-02-19: 650 mg via ORAL
  Filled 2021-02-19: qty 2
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
## 2021-02-19 NOTE — ED Triage Notes (Signed)
Pt  BIB EMS due to chest pain. Pt was at Urgent Care in [place] and they advised her to go to ED. Pt states she has been having chest pain for months. Pt has high BP and takes metoprolol for it. Pt is axo4. VSS.

## 2021-02-20 ENCOUNTER — Emergency Department (HOSPITAL_COMMUNITY): Payer: Medicare Other

## 2021-02-20 LAB — TROPONIN I (HIGH SENSITIVITY): Troponin I (High Sensitivity): 6 ng/L (ref <18)

## 2021-02-20 NOTE — ED Notes (Signed)
Discharge instructions discussed with pt. Pt verbalized understanding. Pt stable and ambulatory.  °

## 2021-03-27 ENCOUNTER — Ambulatory Visit (INDEPENDENT_AMBULATORY_CARE_PROVIDER_SITE_OTHER): Payer: Medicare Other | Admitting: Gastroenterology

## 2021-03-27 ENCOUNTER — Other Ambulatory Visit: Payer: Self-pay

## 2021-03-27 ENCOUNTER — Encounter: Payer: Self-pay | Admitting: Gastroenterology

## 2021-03-27 VITALS — BP 130/82 | HR 80 | Ht 64.0 in | Wt 191.0 lb

## 2021-03-27 DIAGNOSIS — K5909 Other constipation: Secondary | ICD-10-CM | POA: Diagnosis not present

## 2021-03-27 DIAGNOSIS — Z1211 Encounter for screening for malignant neoplasm of colon: Secondary | ICD-10-CM

## 2021-03-27 DIAGNOSIS — Z86010 Personal history of colonic polyps: Secondary | ICD-10-CM

## 2021-03-27 DIAGNOSIS — K219 Gastro-esophageal reflux disease without esophagitis: Secondary | ICD-10-CM | POA: Diagnosis not present

## 2021-03-27 NOTE — Patient Instructions (Addendum)
If you are age 68 or older, your body mass index should be between 23-30. Your Body mass index is 32.79 kg/m. If this is out of the aforementioned range listed, please consider follow up with your Primary Care Provider.  If you are age 52 or younger, your body mass index should be between 19-25. Your Body mass index is 32.79 kg/m. If this is out of the aformentioned range listed, please consider follow up with your Primary Care Provider.   __________________________________________________________  The [place] [facility] providers would like to encourage you to use [facility] to communicate with providers for non-urgent requests or questions.  Due to long hold times on the telephone, sending your provider a message by [facility] may be a faster and more efficient way to get a response.  Please allow 48 business hours for a response.  Please remember that this is for non-urgent requests.   You have been scheduled for an endoscopy and colonoscopy. Please follow the written instructions given to you at your visit today. Please pick up your prep supplies at the pharmacy within the next 1-3 days. If you use inhalers (even only as needed), please bring them with you on the day of your procedure.  Continue omeprazole and senna.  Please purchase the following medications over the counter and take as directed: Miralax 17g 2 times daily Benefiber 1 TBS daily  We have given you samples of the following medication to take: Clenpiq  Please call with any questions of concerns.  Thank you,  Dr. Jackquline Denmark
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
## 2021-04-01 ENCOUNTER — Ambulatory Visit
Admission: RE | Admit: 2021-04-01 | Discharge: 2021-04-01 | Disposition: A | Discharge status: Home / Self Care | Payer: Medicare Other | Source: Ambulatory Visit | Attending: Pulmonary Disease | Admitting: Pulmonary Disease

## 2021-04-01 DIAGNOSIS — R911 Solitary pulmonary nodule: Secondary | ICD-10-CM

## 2021-04-04 ENCOUNTER — Encounter: Payer: Self-pay | Admitting: *Deleted

## 2021-04-04 ENCOUNTER — Other Ambulatory Visit: Payer: Self-pay | Admitting: *Deleted

## 2021-04-04 DIAGNOSIS — R9389 Abnormal findings on diagnostic imaging of other specified body structures: Secondary | ICD-10-CM

## 2021-04-04 NOTE — Progress Notes (Signed)
Patient needs 1 year ct follow up. And appt with me in one year after ct. Looks like her follow up from June was never scheduled.   Thanks,  BLI  Garner Nash, DO [facility] 04/04/2021 4:17 PM

## 2021-04-08 ENCOUNTER — Ambulatory Visit: Payer: Medicare Other | Admitting: Pulmonary Disease

## 2021-04-09 ENCOUNTER — Other Ambulatory Visit: Payer: Self-pay

## 2021-04-09 ENCOUNTER — Ambulatory Visit (AMBULATORY_SURGERY_CENTER): Payer: Medicare Other | Admitting: Gastroenterology

## 2021-04-09 ENCOUNTER — Encounter: Payer: Self-pay | Admitting: Gastroenterology

## 2021-04-09 VITALS — BP 98/75 | HR 58 | Temp 97.8°F | Resp 14 | Ht 64.0 in | Wt 191.0 lb

## 2021-04-09 DIAGNOSIS — D124 Benign neoplasm of descending colon: Secondary | ICD-10-CM | POA: Diagnosis not present

## 2021-04-09 DIAGNOSIS — K317 Polyp of stomach and duodenum: Secondary | ICD-10-CM

## 2021-04-09 DIAGNOSIS — K2970 Gastritis, unspecified, without bleeding: Secondary | ICD-10-CM

## 2021-04-09 DIAGNOSIS — D126 Benign neoplasm of colon, unspecified: Secondary | ICD-10-CM | POA: Diagnosis not present

## 2021-04-09 DIAGNOSIS — D125 Benign neoplasm of sigmoid colon: Secondary | ICD-10-CM

## 2021-04-09 DIAGNOSIS — Z86010 Personal history of colonic polyps: Secondary | ICD-10-CM

## 2021-04-09 DIAGNOSIS — D123 Benign neoplasm of transverse colon: Secondary | ICD-10-CM | POA: Diagnosis not present

## 2021-04-09 DIAGNOSIS — K219 Gastro-esophageal reflux disease without esophagitis: Secondary | ICD-10-CM

## 2021-04-09 DIAGNOSIS — K449 Diaphragmatic hernia without obstruction or gangrene: Secondary | ICD-10-CM | POA: Diagnosis not present

## 2021-04-09 DIAGNOSIS — K2289 Other specified disease of esophagus: Secondary | ICD-10-CM

## 2021-04-09 MED ORDER — SODIUM CHLORIDE 0.9 % IV SOLN: 500.0000 mL | Freq: Once | INTRAVENOUS | Status: DC

## 2021-04-09 NOTE — Progress Notes (Signed)
DT VS 

## 2021-04-09 NOTE — Patient Instructions (Addendum)
HANDOUTS PROVIDED ON: HIATAL HERNIA, GASTRITIS, POLYPS, DIVERTICULOSIS, & HEMORRHOIDS  The polyps removed/biopsies taken today have been sent for pathology.  The results can take 1-3 weeks to receive.  When your next colonoscopy should occur will be based on the pathology results.    You may resume your previous diet and medication schedule.  Continue to take 17 g of Miralax and Benefiber daily.  Gradually wean off of senna to lowest possible dose.  Thank you for allowing [place] to care for you today!!!  YOU HAD AN ENDOSCOPIC PROCEDURE TODAY AT [place]:   Refer to the procedure report that was given to you for any specific questions about what was found during the examination.  If the procedure report does not answer your questions, please call your gastroenterologist to clarify.  If you requested that your care partner not be given the details of your procedure findings, then the procedure report has been included in a sealed envelope for you to review at your convenience later.  YOU SHOULD EXPECT: Some feelings of bloating in the abdomen. Passage of more gas than usual.  Walking can help get rid of the air that was put into your GI tract during the procedure and reduce the bloating. If you had a lower endoscopy (such as a colonoscopy or flexible sigmoidoscopy) you may notice spotting of blood in your stool or on the toilet paper. If you underwent a bowel prep for your procedure, you may not have a normal bowel movement for a few days.  Please Note:  You might notice some irritation and congestion in your nose or some drainage.  This is from the oxygen used during your procedure.  There is no need for concern and it should clear up in a day or so.  SYMPTOMS TO REPORT IMMEDIATELY:  Following lower endoscopy (colonoscopy or flexible sigmoidoscopy):  Excessive amounts of blood in the stool  Significant tenderness or worsening of abdominal pains  Swelling of the abdomen that is new,  acute  Fever of 100F or higher  Following upper endoscopy (EGD)  Vomiting of blood or coffee ground material  New chest pain or pain under the shoulder blades  Painful or persistently difficult swallowing  New shortness of breath  Fever of 100F or higher  Black, tarry-looking stools  For urgent or emergent issues, a gastroenterologist can be reached at any hour by calling [phone]. Do not use MyChart messaging for urgent concerns.    DIET:  We do recommend a small meal at first, but then you may proceed to your regular diet.  Drink plenty of fluids but you should avoid alcoholic beverages for 24 hours.  ACTIVITY:  You should plan to take it easy for the rest of today and you should NOT DRIVE or use heavy machinery until tomorrow (because of the sedation medicines used during the test).    FOLLOW UP: Our staff will call the number listed on your records THURSDAY MORNING BETWEEN 7:15 AM AND 8:15 AM following your procedure to check on you and address any questions or concerns that you may have regarding the information given to you following your procedure. If we do not reach you, we will leave a message.  We will attempt to reach you two times.  During this call, we will ask if you have developed any symptoms of COVID 19. If you develop any symptoms (ie: fever, flu-like symptoms, shortness of breath, cough etc.) before then, please call [phone].  If you  test positive for Covid 19 in the 2 weeks post procedure, please call and report this information to [place].    If any biopsies were taken you will be contacted by phone or by letter within the next 1-3 weeks.  Please call us at [phone] if you have not heard about the biopsies in 3 weeks.    SIGNATURES/CONFIDENTIALITY: You and/or your care partner have signed paperwork which will be entered into your electronic medical record.  These signatures attest to the fact that that the information above on your After Visit Summary has  been reviewed and is understood.  Full responsibility of the confidentiality of this discharge information lies with you and/or your care-partner.
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
## 2021-04-09 NOTE — Progress Notes (Signed)
Pt Drowsy. VSS. To PACU, report to RN. No anesthetic complications noted.  

## 2021-04-09 NOTE — Op Note (Signed)
[facility] Patient Name: Molly Friedman Procedure Date: 04/09/2021 2:48 PM MRN: [id number] Endoscopist: Jackquline Denmark , MD Age: 70 Referring MD:  Date of Birth: Aug 15, 1950 Gender: Female Account #: [id number] Procedure:                Colonoscopy Indications:              High risk colon cancer surveillance: Personal                            history of colonic polyps Medicines:                Monitored Anesthesia Care Procedure:                Pre-Anesthesia Assessment:                           - Prior to the procedure, a History and Physical                            was performed, and patient medications and                            allergies were reviewed. The patient's tolerance of                            previous anesthesia was also reviewed. The risks                            and benefits of the procedure and the sedation                            options and risks were discussed with the patient.                            All questions were answered, and informed consent                            was obtained. Prior Anticoagulants: The patient has                            taken no previous anticoagulant or antiplatelet                            agents. ASA Grade Assessment: II - A patient with                            mild systemic disease. After reviewing the risks                            and benefits, the patient was deemed in                            satisfactory condition to undergo the procedure.  After obtaining informed consent, the colonoscope                            was passed under direct vision. Throughout the                            procedure, the patient's blood pressure, pulse, and                            oxygen saturations were monitored continuously. The                            Olympus PCF-H190DL (#[id number]) Colonoscope was                            introduced through the anus and advanced to  the 2                            cm into the ileum. The colonoscopy was performed                            without difficulty. The patient tolerated the                            procedure well. The quality of the bowel                            preparation was adequate to identify polyps. The                            terminal ileum, ileocecal valve, appendiceal                            orifice, and rectum were photographed. Scope In: 3:05:23 PM Scope Out: 3:27:56 PM Scope Withdrawal Time: 0 hours 19 minutes 17 seconds  Total Procedure Duration: 0 hours 22 minutes 33 seconds  Findings:                 Four sessile polyps were found in the mid sigmoid                            colon, descending colon and hepatic flexure. The                            polyps were 6 to 8 mm in size. These polyps were                            removed with a cold snare. Resection and retrieval                            were complete.                           Two sessile polyps were found in the mid  transverse                            colon. The polyps were 2 mm in size. These polyps                            were removed with a cold biopsy forceps. Resection                            and retrieval were complete.                           A few rare small diverticula were found in the                            sigmoid colon and ascending colon.                           There was a medium-sized lipoma, 10 mm in diameter,                            in the mid ascending colon.                           A diffuse area of mild melanosis was found in the                            entire colon.                           Non-bleeding internal hemorrhoids were found during                            retroflexion. The hemorrhoids were small and Grade                            I (internal hemorrhoids that do not prolapse).                           The terminal ileum appeared normal.                            The exam was otherwise without abnormality on                            direct and retroflexion views. Complications:            No immediate complications. Estimated Blood Loss:     Estimated blood loss: none. Impression:               - Colonic polyps s/p polypectomy                           - Mild colonic diverticulosis.                           -  Moderate melanosis coli.                           - Non-bleeding internal hemorrhoids.                           - The examined portion of the ileum was normal.                           - The examination was otherwise normal on direct                            and retroflexion views. Recommendation:           - Patient has a contact number available for                            emergencies. The signs and symptoms of potential                            delayed complications were discussed with the                            patient. Return to normal activities tomorrow.                            Written discharge instructions were provided to the                            patient.                           - Resume previous diet.                           - Continue present medications Including MiraLAX 17                            g p.o. once a day and Benefiber daily.                           - Gradually wean off senna to the lowest possible                            dose.                           - Await pathology results.                           - Repeat colonoscopy for surveillance based on                            pathology results.                           - The findings and recommendations were discussed  with the patient's family. Jackquline Denmark, MD 04/09/2021 3:44:01 PM This report has been signed electronically.

## 2021-04-09 NOTE — Op Note (Signed)
[facility] Patient Name: Molly Friedman Procedure Date: 04/09/2021 2:49 PM MRN: [id number] Endoscopist: Jackquline Denmark , MD Age: 70 Referring MD:  Date of Birth: 1950-07-23 Gender: Female Account #: [id number] Procedure:                Upper GI endoscopy Indications:              Atypical chest pains with negative cardiac eval.                            GERD. Medicines:                Monitored Anesthesia Care Procedure:                Pre-Anesthesia Assessment:                           - Prior to the procedure, a History and Physical                            was performed, and patient medications and                            allergies were reviewed. The patient's tolerance of                            previous anesthesia was also reviewed. The risks                            and benefits of the procedure and the sedation                            options and risks were discussed with the patient.                            All questions were answered, and informed consent                            was obtained. Prior Anticoagulants: The patient has                            taken no previous anticoagulant or antiplatelet                            agents. ASA Grade Assessment: II - A patient with                            mild systemic disease. After reviewing the risks                            and benefits, the patient was deemed in                            satisfactory condition to undergo the procedure.  After obtaining informed consent, the endoscope was                            passed under direct vision. Throughout the                            procedure, the patient's blood pressure, pulse, and                            oxygen saturations were monitored continuously. The                            Endoscope was introduced through the mouth, and                            advanced to the second part of duodenum. The upper                             GI endoscopy was accomplished without difficulty.                            The patient tolerated the procedure well. Scope In: Scope Out: Findings:                 The examined esophagus was normal with well-defined                            Z-line at 36 cm. Biopsies were obtained from the                            proximal and distal esophagus with cold forceps for                            histology of suspected eosinophilic esophagitis.                           A small transient hiatal hernia was present. Best                            visualized on full distention of the lower                            esophagus and valsalva                           Localized minimal inflammation characterized by                            erythema was found in the gastric antrum. Biopsies                            were taken with a cold forceps for histology.  A few 2 to 3 mm sessile polyps with no bleeding and                            no stigmata of recent bleeding were found in the                            gastric fundus. Three polyps were removed with a                            cold biopsy forceps. Resection and retrieval were                            complete.                           The examined duodenum was normal. Biopsies for                            histology were taken with a cold forceps for                            evaluation of celiac disease. Complications:            No immediate complications. Estimated Blood Loss:     Estimated blood loss: none. Impression:               - Small hiatal hernia.                           - Gastritis. Biopsied.                           - A few gastric polyps. (Resected and retrieved x 3) Recommendation:           - Patient has a contact number available for                            emergencies. The signs and symptoms of potential                            delayed complications  were discussed with the                            patient. Return to normal activities tomorrow.                            Written discharge instructions were provided to the                            patient.                           - Resume previous diet.                           - Continue present medications.                           -  Await pathology results.                           - FU if still with problems.                           - The findings and recommendations were discussed                            with the patient's family. Jackquline Denmark, MD 04/09/2021 3:34:52 PM This report has been signed electronically.

## 2021-04-11 ENCOUNTER — Telehealth: Payer: Self-pay

## 2021-04-11 NOTE — Telephone Encounter (Signed)
  Follow up Call-  Call back number 04/09/2021  Post procedure Call Back phone  # [phone] hm  Permission to leave phone message Yes  Some recent data might be hidden     Patient questions:  Do you have a fever, pain , or abdominal swelling? No. Pain Score  0 *  Have you tolerated food without any problems? Yes.    Have you been able to return to your normal activities? Yes.    Do you have any questions about your discharge instructions: Diet   No. Medications  No. Follow up visit  No.  Do you have questions or concerns about your Care? No.  Actions: * If pain score is 4 or above: No action needed, pain <4.   Pt reported a "little discomfort from the upper".  Pt is eatting and swallowing without difficulty.   Have you developed a fever since your procedure? no  2.   Have you had an respiratory symptoms (SOB or cough) since your procedure? no  3.   Have you tested positive for COVID 19 since your procedure no  4.   Have you had any family members/close contacts diagnosed with the COVID 19 since your procedure?  no   If yes to any of these questions please route to Joylene John, RN and Joella Prince, RN

## 2021-04-23 ENCOUNTER — Encounter: Payer: Self-pay | Admitting: Gastroenterology

## 2022-04-03 ENCOUNTER — Ambulatory Visit
Admission: RE | Admit: 2022-04-03 | Discharge: 2022-04-03 | Disposition: A | Discharge status: Home / Self Care | Payer: Medicare Other | Source: Ambulatory Visit | Attending: Pulmonary Disease | Admitting: Pulmonary Disease

## 2022-04-03 DIAGNOSIS — R9389 Abnormal findings on diagnostic imaging of other specified body structures: Secondary | ICD-10-CM

## 2022-04-14 ENCOUNTER — Ambulatory Visit: Payer: Medicare Other | Admitting: Pulmonary Disease

## 2022-04-14 ENCOUNTER — Encounter: Payer: Self-pay | Admitting: Pulmonary Disease

## 2022-04-14 VITALS — BP 128/80 | HR 81 | Ht 64.0 in | Wt 193.0 lb

## 2022-04-14 DIAGNOSIS — R911 Solitary pulmonary nodule: Secondary | ICD-10-CM

## 2022-04-14 DIAGNOSIS — I7090 Unspecified atherosclerosis: Secondary | ICD-10-CM

## 2022-04-14 NOTE — Patient Instructions (Signed)
Thank you for visiting Dr. Valeta Harms at [facility]. Today we recommend the following:  Orders Placed This Encounter  Procedures   CT CHEST WO CONTRAST   Please see [place] after your ct chest in 1 year   Return in about 1 year (around 04/15/2023) for with Eric Form, NP.    Please do your part to reduce the spread of COVID-19.
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
## 2022-09-05 IMAGING — DX DG CHEST 1V PORT
1 series · 1 of 1 positions shown · non-contrast
Comparison: Radiograph 11/06/2020.  CT 10/29/2020

CLINICAL DATA: Chest pain.  Chest pain for months.

EXAM:
PORTABLE CHEST 1 VIEW

[chest]
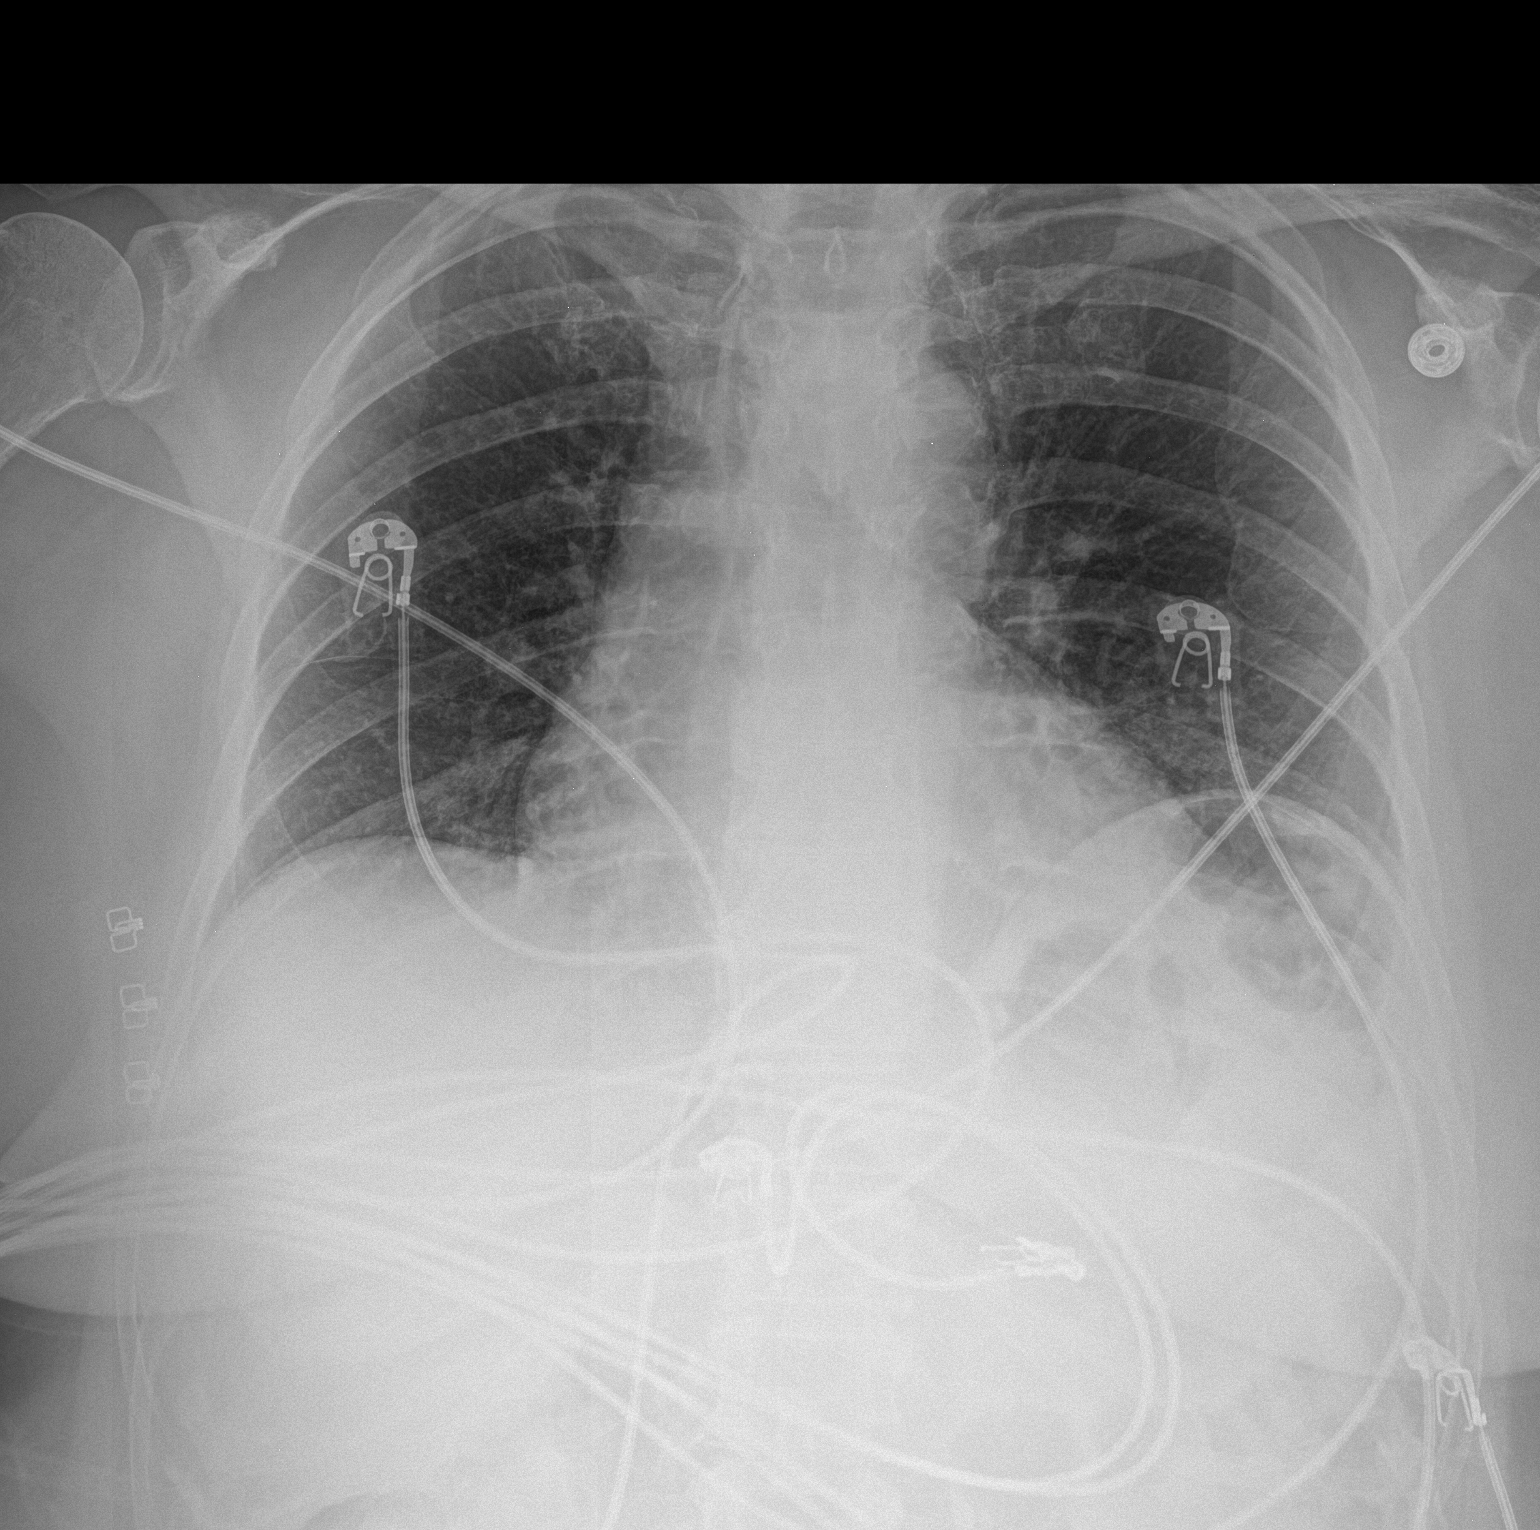

[1 of 1 positions shown; findings below may reference images not displayed]

FINDINGS: Lower lung volumes from prior exam. Normal heart size with stable
mediastinal contours. No acute or focal airspace disease. No pleural
effusion. No pneumothorax. No pulmonary edema. No acute osseous
abnormalities are seen.
IMPRESSION: Low lung volumes without acute findings.

## 2022-10-16 IMAGING — CT CT CHEST SUPER D W/O CM
2 of 5 series · 14 of 36 positions shown, 17 images · non-contrast
Comparison: 10/29/2020 chest CT.

CLINICAL DATA: Follow-up pulmonary nodules.

EXAM:
CT CHEST WITHOUT CONTRAST
TECHNIQUE: Multidetector CT imaging of the chest was performed using thin slice
collimation for electromagnetic bronchoscopy planning purposes,
without intravenous contrast.

[Series 4: chest 2.00 br40 s3 · coronal · 0.56mm/px · 3 of 171 slices shown]
[im 35/171  lung]
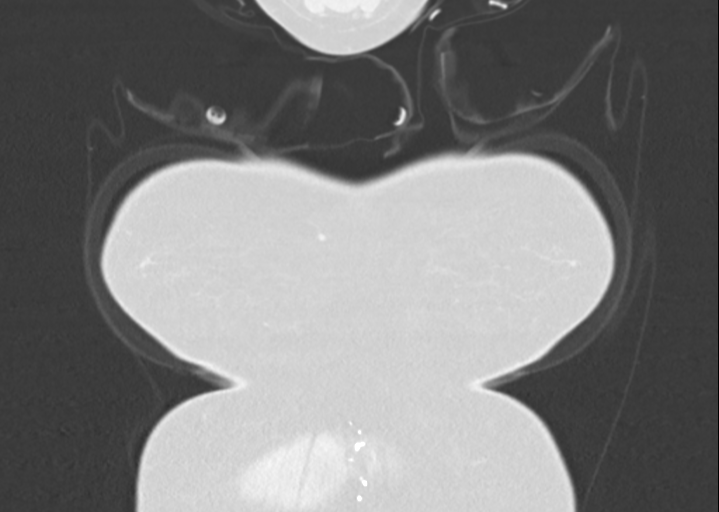
[im 69/171  lung]
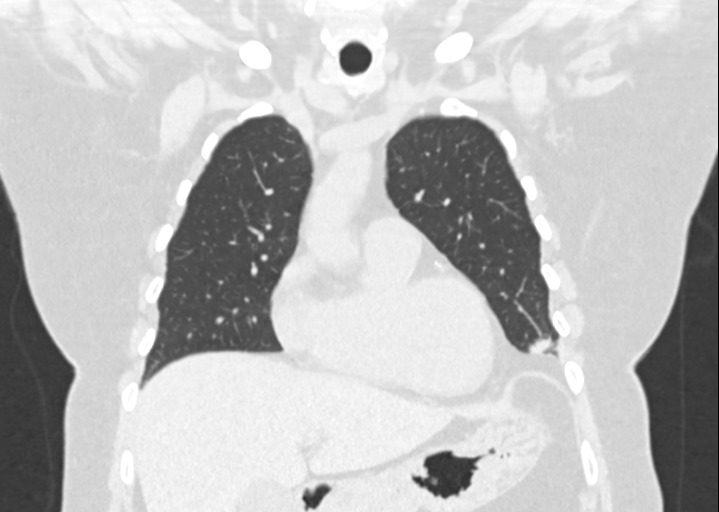
[im 103/171  lung]
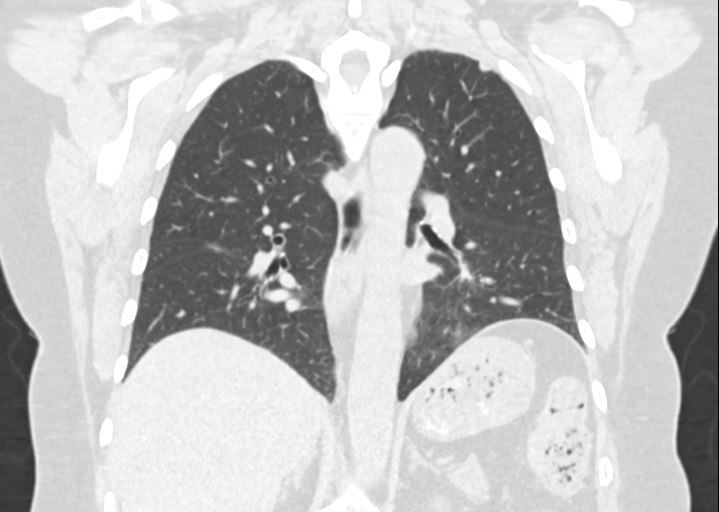

[Series 10: chest 1.00 br40 s3 super d · axial · 0.79mm/px · z∈[+1601,+1850]mm · 11 of 361 slices shown, 14 images]
[im 25/361  mediastinal]
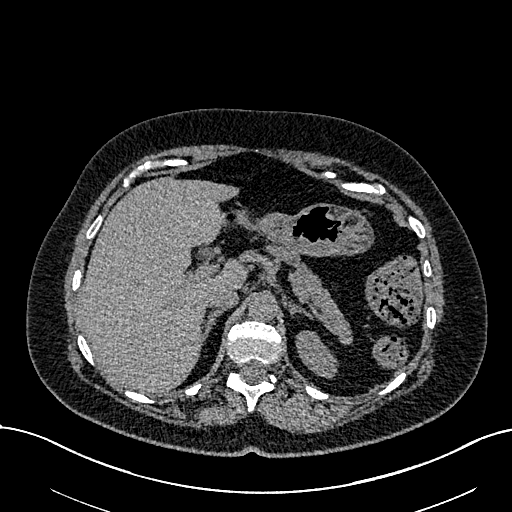
[im 25/361  lung]
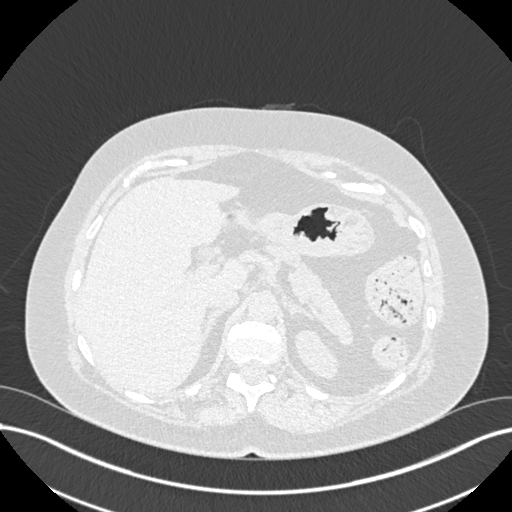
[im 49/361  lung]
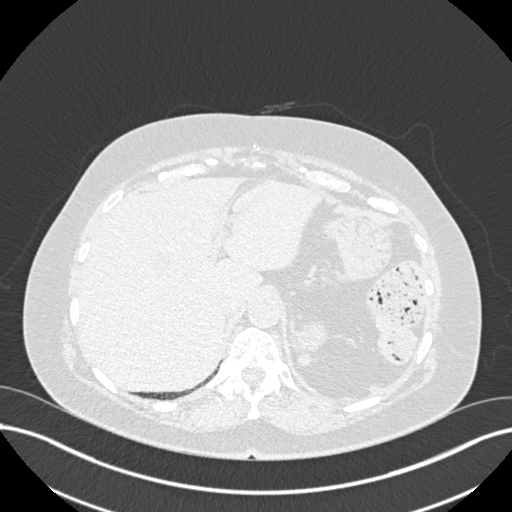
[im 97/361  lung]
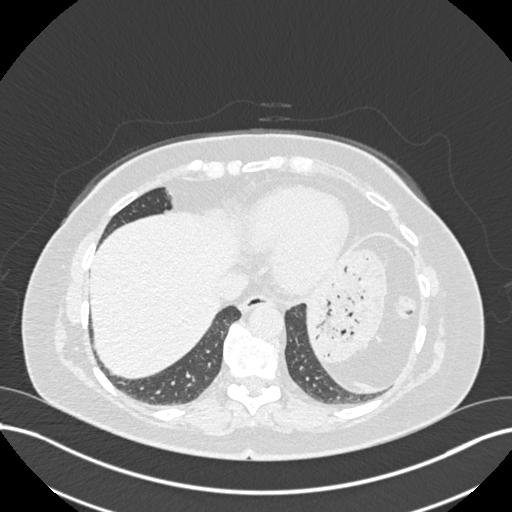
[im 121/361  lung]
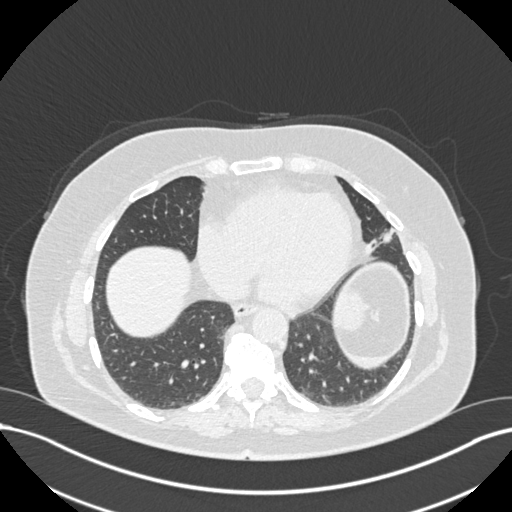
[im 145/361  mediastinal]
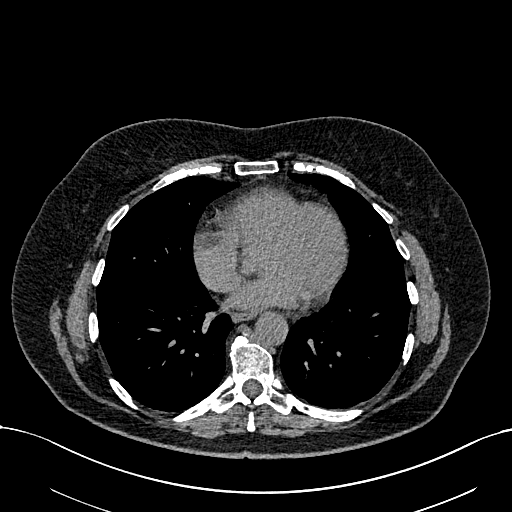
[im 145/361  lung]
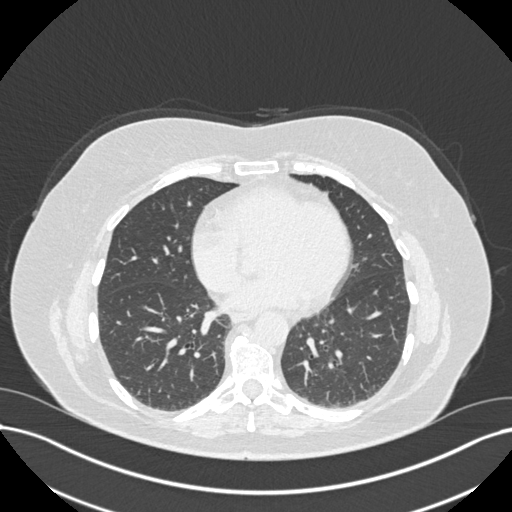
[im 193/361  lung]
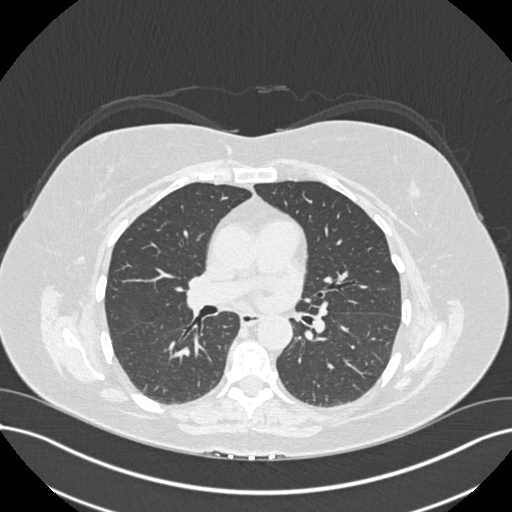
[im 217/361  lung]
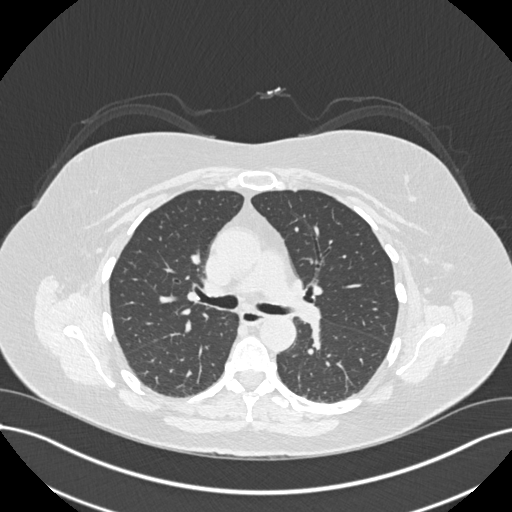
[im 241/361  lung]
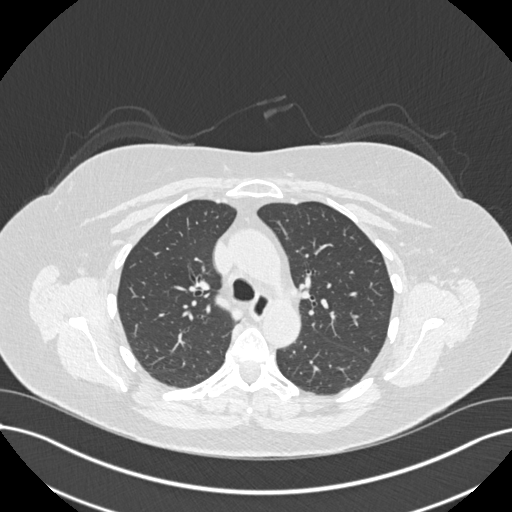
[im 265/361  mediastinal]
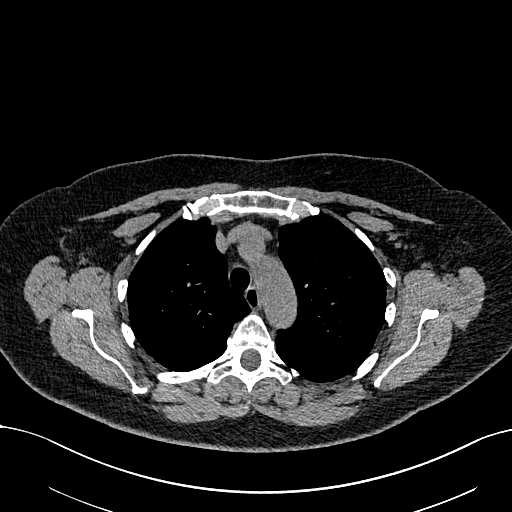
[im 265/361  lung]
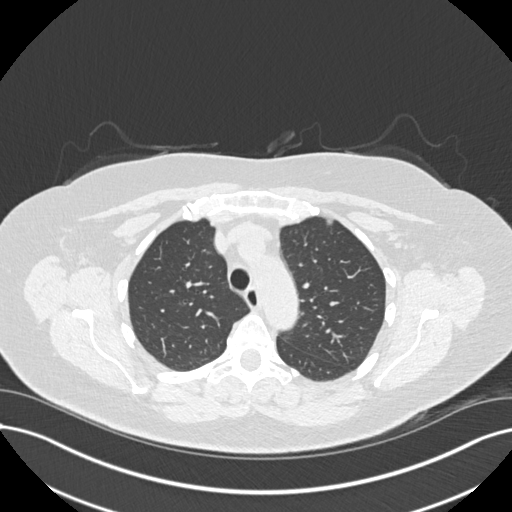
[im 313/361  lung]
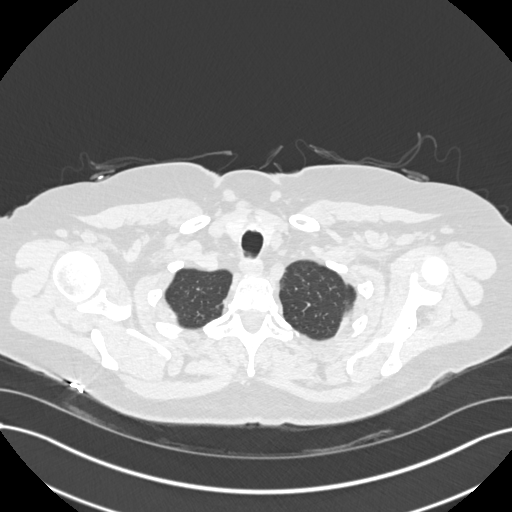
[im 337/361  lung]
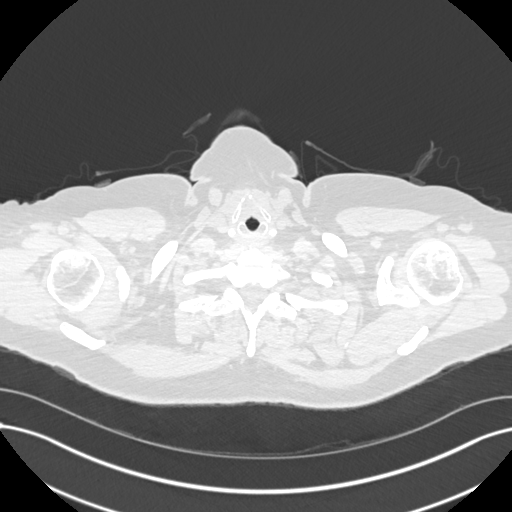

[14 of 36 positions shown; findings below may reference images not displayed]

FINDINGS: Cardiovascular: Normal heart size. No significant pericardial
effusion/thickening. Left anterior descending and right coronary
atherosclerosis. Mildly atherosclerotic nonaneurysmal thoracic
aorta. Normal caliber pulmonary arteries.

Mediastinum/Nodes: No discrete thyroid nodules. Unremarkable
esophagus. No pathologically enlarged axillary, mediastinal or hilar
lymph nodes, noting limited sensitivity for the detection of hilar
adenopathy on this noncontrast study.

Lungs/Pleura: No pneumothorax. No pleural effusion. Solid 0.4 cm
right lower lobe pulmonary nodule (series 8/image 70), stable since
08/07/2020 chest CT. Indistinct subsolid 0.8 x 0.5 cm peripheral
left upper lobe pulmonary nodule (series 8/image 65), stable since
08/07/2020 chest CT. No acute consolidative airspace disease or lung
masses. No new significant pulmonary nodules. Stable mildly
thickened curvilinear parenchymal band in lingula compatible with
nonspecific postinfectious/postinflammatory scarring.

Upper abdomen: Splenectomy.

Musculoskeletal: No aggressive appearing focal osseous lesions.
Moderate thoracic spondylosis.
IMPRESSION: 1. Continued stability of subsolid 0.8 cm peripheral left upper lobe
pulmonary nodule, for which 7 month stability has been demonstrated.
Annual noncontrast chest CT is recommended until 5 years of
stability has been established. If persistent these nodules should
be considered highly suspicious if the solid component of the nodule
is 6 mm or greater in size and enlarging. This recommendation
follows the consensus statement: Guidelines for Management of
Incidental Pulmonary Nodules Detected on CT Images: From the
2. Solid 0.4 cm right lower lobe pulmonary nodule is also stable for
7 months and probably benign, and can also be reassessed on
follow-up chest CT in 12 months.
3. Two-vessel coronary atherosclerosis.
4. Aortic Atherosclerosis ([id number]).

## 2023-03-11 ENCOUNTER — Telehealth: Payer: Self-pay | Admitting: Pulmonary Disease

## 2023-03-11 NOTE — Telephone Encounter (Signed)
Patient calling to schedule CT scan that is due in October. Patient did not want to schedule follow up with Sarah until the CT scan was scheduled. Please advise on scan.

## 2023-04-13 ENCOUNTER — Ambulatory Visit (HOSPITAL_BASED_OUTPATIENT_CLINIC_OR_DEPARTMENT_OTHER)
Admission: RE | Admit: 2023-04-13 | Discharge: 2023-04-13 | Disposition: A | Discharge status: Home / Self Care | Payer: Medicare Other | Source: Ambulatory Visit | Attending: Pulmonary Disease | Admitting: Pulmonary Disease

## 2023-04-13 DIAGNOSIS — R911 Solitary pulmonary nodule: Secondary | ICD-10-CM | POA: Insufficient documentation

## 2023-04-15 ENCOUNTER — Ambulatory Visit: Payer: Medicare Other | Admitting: Pulmonary Disease

## 2023-04-21 NOTE — Progress Notes (Signed)
Seeing SG in follow up on 10/23  Thanks,  BLI  Josephine Igo, DO [facility] 04/21/2023 3:26 PM

## 2023-04-22 ENCOUNTER — Ambulatory Visit: Payer: Medicare Other | Admitting: Acute Care

## 2023-04-22 ENCOUNTER — Encounter: Payer: Self-pay | Admitting: Acute Care

## 2023-04-22 VITALS — BP 132/84 | HR 77 | Temp 97.8°F | Ht 64.0 in | Wt 192.4 lb

## 2023-04-22 DIAGNOSIS — R911 Solitary pulmonary nodule: Secondary | ICD-10-CM | POA: Diagnosis not present

## 2023-04-22 NOTE — Patient Instructions (Addendum)
It is good to see you today.  The lung nodule we have been following remains stable. Recommendation is for [place] to follow this for up to 5 years stability. We will re-scan you in October of 2025. You will get a call closer to the time to get this scheduled.  If you have not had a call to get this scheduled by [facility] September 2025  please call  the office.  Follow up with Maralyn Sago NP October 2025 to review results. Call if you need [place] sooner.  Follow up with cardiology 05/2023 as is scheduled for follow up of Aortic atherosclerosis and Coronary artery calcification. Please contact office for sooner follow up if symptoms do not improve or worsen or seek emergency care
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
## 2024-03-07 ENCOUNTER — Telehealth: Payer: Self-pay | Admitting: Acute Care

## 2024-03-07 NOTE — Telephone Encounter (Signed)
 Received VM from patient requesting to schedule her annual CT chest due in 03/2024. Will forward to PCCs to schedule.

## 2024-03-08 NOTE — Telephone Encounter (Signed)
 Patient has been scheduled for CT and follow up with Sarah groce. Patient is aware of appt. NFN

## 2024-03-13 ENCOUNTER — Other Ambulatory Visit (HOSPITAL_BASED_OUTPATIENT_CLINIC_OR_DEPARTMENT_OTHER)

## 2024-04-12 ENCOUNTER — Other Ambulatory Visit (HOSPITAL_BASED_OUTPATIENT_CLINIC_OR_DEPARTMENT_OTHER)

## 2024-04-13 ENCOUNTER — Ambulatory Visit (INDEPENDENT_AMBULATORY_CARE_PROVIDER_SITE_OTHER)
Admission: RE | Admit: 2024-04-13 | Discharge: 2024-04-13 | Disposition: A | Discharge status: Home / Self Care | Source: Ambulatory Visit | Attending: Acute Care | Admitting: Acute Care

## 2024-04-13 DIAGNOSIS — R911 Solitary pulmonary nodule: Secondary | ICD-10-CM | POA: Diagnosis not present

## 2024-04-19 ENCOUNTER — Encounter: Payer: Self-pay | Admitting: Acute Care

## 2024-04-19 ENCOUNTER — Ambulatory Visit: Admitting: Acute Care

## 2024-04-19 VITALS — BP 154/88 | HR 73 | Temp 98.0°F | Ht 64.0 in | Wt 196.4 lb

## 2024-04-19 DIAGNOSIS — E669 Obesity, unspecified: Secondary | ICD-10-CM | POA: Diagnosis not present

## 2024-04-19 DIAGNOSIS — I10 Essential (primary) hypertension: Secondary | ICD-10-CM

## 2024-04-19 DIAGNOSIS — R911 Solitary pulmonary nodule: Secondary | ICD-10-CM

## 2024-04-19 DIAGNOSIS — R9389 Abnormal findings on diagnostic imaging of other specified body structures: Secondary | ICD-10-CM | POA: Diagnosis not present

## 2024-04-19 DIAGNOSIS — E662 Morbid (severe) obesity with alveolar hypoventilation: Secondary | ICD-10-CM

## 2024-04-19 NOTE — Patient Instructions (Addendum)
 It is good to see you today. We have reviewed your CT Chest.  This shows a stable pulmonary nodule.  We will do a 1 year follow up CT Chest to monitor for and changes. This will be due 03/2025. You will get a call to get this scheduled closer to the time.  You will follow up with me after the scan to review results. Call for any unexplained weight loss or blood in your sputum when you cough so we can see you sooner.  I have referred you to [facility]. You will get a call to get this scheduled.  They are wonderful.  Call if you need us  sooner. Keep monitoring your Blood Pressure as you have been doing, It was high today in the office. We will recheck this before you leave today.  Please contact office for sooner follow up if symptoms do not improve or worsen or seek emergency care
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
## 2024-04-25 ENCOUNTER — Encounter (INDEPENDENT_AMBULATORY_CARE_PROVIDER_SITE_OTHER): Payer: Self-pay

## 2024-04-27 ENCOUNTER — Encounter: Payer: Self-pay | Admitting: Gastroenterology

## 2024-05-23 ENCOUNTER — Encounter

## 2024-05-28 ENCOUNTER — Encounter: Payer: Self-pay | Admitting: Gastroenterology

## 2024-06-06 ENCOUNTER — Encounter: Admitting: Gastroenterology

## 2024-06-08 ENCOUNTER — Encounter: Admitting: Gastroenterology
